# Patient Record
Sex: Female | Born: 1960 | Hispanic: No | Marital: Single | Smoking: Current every day smoker
Health system: Southern US, Community
[De-identification: ages and names within clinical notes are randomized; demographics above are authoritative.]

## PROBLEM LIST (undated history)

## (undated) DIAGNOSIS — K219 Gastro-esophageal reflux disease without esophagitis: Secondary | ICD-10-CM

## (undated) DIAGNOSIS — F988 Other specified behavioral and emotional disorders with onset usually occurring in childhood and adolescence: Secondary | ICD-10-CM

## (undated) DIAGNOSIS — F329 Major depressive disorder, single episode, unspecified: Secondary | ICD-10-CM

## (undated) DIAGNOSIS — F32A Depression, unspecified: Secondary | ICD-10-CM

## (undated) DIAGNOSIS — J45909 Unspecified asthma, uncomplicated: Secondary | ICD-10-CM

## (undated) DIAGNOSIS — F411 Generalized anxiety disorder: Secondary | ICD-10-CM

## (undated) DIAGNOSIS — F419 Anxiety disorder, unspecified: Secondary | ICD-10-CM

## (undated) HISTORY — DX: Gastro-esophageal reflux disease without esophagitis: K21.9

## (undated) HISTORY — DX: Unspecified asthma, uncomplicated: J45.909

---

## 1998-05-01 ENCOUNTER — Inpatient Hospital Stay (HOSPITAL_COMMUNITY): Admission: AD | Admit: 1998-05-01 | Discharge: 1998-05-01 | Payer: Self-pay | Admitting: Obstetrics & Gynecology

## 1998-08-22 ENCOUNTER — Inpatient Hospital Stay (HOSPITAL_COMMUNITY): Admission: AD | Admit: 1998-08-22 | Discharge: 1998-08-22 | Payer: Self-pay | Admitting: Obstetrics and Gynecology

## 1998-09-06 ENCOUNTER — Inpatient Hospital Stay (HOSPITAL_COMMUNITY): Admission: AD | Admit: 1998-09-06 | Discharge: 1998-09-08 | Payer: Self-pay

## 1998-10-23 ENCOUNTER — Other Ambulatory Visit: Admission: RE | Admit: 1998-10-23 | Discharge: 1998-10-23 | Payer: Self-pay | Admitting: Obstetrics and Gynecology

## 1999-06-04 ENCOUNTER — Other Ambulatory Visit: Admission: RE | Admit: 1999-06-04 | Discharge: 1999-06-04 | Payer: Self-pay | Admitting: Obstetrics and Gynecology

## 1999-10-31 ENCOUNTER — Other Ambulatory Visit: Admission: RE | Admit: 1999-10-31 | Discharge: 1999-10-31 | Payer: Self-pay | Admitting: Obstetrics and Gynecology

## 2001-07-24 ENCOUNTER — Encounter: Payer: Self-pay | Admitting: Internal Medicine

## 2001-07-24 ENCOUNTER — Ambulatory Visit (HOSPITAL_COMMUNITY): Admission: RE | Admit: 2001-07-24 | Discharge: 2001-07-24 | Payer: Self-pay | Admitting: Internal Medicine

## 2001-08-13 ENCOUNTER — Ambulatory Visit (HOSPITAL_COMMUNITY): Admission: RE | Admit: 2001-08-13 | Discharge: 2001-08-13 | Payer: Self-pay | Admitting: Internal Medicine

## 2001-11-09 ENCOUNTER — Encounter: Payer: Self-pay | Admitting: Internal Medicine

## 2001-11-09 ENCOUNTER — Encounter: Admission: RE | Admit: 2001-11-09 | Discharge: 2001-11-09 | Payer: Self-pay | Admitting: Internal Medicine

## 2002-01-15 ENCOUNTER — Other Ambulatory Visit: Admission: RE | Admit: 2002-01-15 | Discharge: 2002-01-15 | Payer: Self-pay | Admitting: Obstetrics & Gynecology

## 2003-02-05 HISTORY — PX: BUNIONECTOMY: SHX129

## 2003-03-25 ENCOUNTER — Other Ambulatory Visit: Admission: RE | Admit: 2003-03-25 | Discharge: 2003-03-25 | Payer: Self-pay | Admitting: Obstetrics & Gynecology

## 2004-04-04 ENCOUNTER — Encounter: Admission: RE | Admit: 2004-04-04 | Discharge: 2004-04-04 | Payer: Self-pay | Admitting: Internal Medicine

## 2008-04-21 ENCOUNTER — Emergency Department (HOSPITAL_COMMUNITY): Admission: EM | Admit: 2008-04-21 | Discharge: 2008-04-21 | Payer: Self-pay | Admitting: Emergency Medicine

## 2009-06-29 ENCOUNTER — Encounter: Admission: RE | Admit: 2009-06-29 | Discharge: 2009-06-29 | Payer: Self-pay | Admitting: Internal Medicine

## 2010-02-25 ENCOUNTER — Encounter: Payer: Self-pay | Admitting: Obstetrics & Gynecology

## 2010-02-25 ENCOUNTER — Encounter: Payer: Self-pay | Admitting: *Deleted

## 2010-05-17 LAB — URINE MICROSCOPIC-ADD ON

## 2010-05-17 LAB — URINALYSIS, ROUTINE W REFLEX MICROSCOPIC
Glucose, UA: NEGATIVE mg/dL
Protein, ur: NEGATIVE mg/dL
Specific Gravity, Urine: 1.01 (ref 1.005–1.030)

## 2012-11-06 ENCOUNTER — Other Ambulatory Visit: Payer: Self-pay | Admitting: Internal Medicine

## 2012-11-06 ENCOUNTER — Encounter (HOSPITAL_COMMUNITY): Payer: Self-pay

## 2012-11-06 ENCOUNTER — Emergency Department (HOSPITAL_COMMUNITY)
Admission: EM | Admit: 2012-11-06 | Discharge: 2012-11-06 | Disposition: A | Discharge status: Home / Self Care | Payer: BC Managed Care – PPO | Attending: Emergency Medicine | Admitting: Emergency Medicine

## 2012-11-06 DIAGNOSIS — F988 Other specified behavioral and emotional disorders with onset usually occurring in childhood and adolescence: Secondary | ICD-10-CM | POA: Insufficient documentation

## 2012-11-06 DIAGNOSIS — R1310 Dysphagia, unspecified: Secondary | ICD-10-CM

## 2012-11-06 DIAGNOSIS — T43205A Adverse effect of unspecified antidepressants, initial encounter: Secondary | ICD-10-CM | POA: Insufficient documentation

## 2012-11-06 DIAGNOSIS — F3289 Other specified depressive episodes: Secondary | ICD-10-CM | POA: Insufficient documentation

## 2012-11-06 DIAGNOSIS — T887XXA Unspecified adverse effect of drug or medicament, initial encounter: Secondary | ICD-10-CM

## 2012-11-06 DIAGNOSIS — F329 Major depressive disorder, single episode, unspecified: Secondary | ICD-10-CM | POA: Insufficient documentation

## 2012-11-06 DIAGNOSIS — K219 Gastro-esophageal reflux disease without esophagitis: Secondary | ICD-10-CM

## 2012-11-06 DIAGNOSIS — G2581 Restless legs syndrome: Secondary | ICD-10-CM | POA: Diagnosis present

## 2012-11-06 DIAGNOSIS — T50905A Adverse effect of unspecified drugs, medicaments and biological substances, initial encounter: Secondary | ICD-10-CM

## 2012-11-06 DIAGNOSIS — F411 Generalized anxiety disorder: Secondary | ICD-10-CM | POA: Insufficient documentation

## 2012-11-06 DIAGNOSIS — Z79899 Other long term (current) drug therapy: Secondary | ICD-10-CM | POA: Insufficient documentation

## 2012-11-06 HISTORY — DX: Anxiety disorder, unspecified: F41.9

## 2012-11-06 HISTORY — DX: Other specified behavioral and emotional disorders with onset usually occurring in childhood and adolescence: F98.8

## 2012-11-06 HISTORY — DX: Depression, unspecified: F32.A

## 2012-11-06 HISTORY — DX: Major depressive disorder, single episode, unspecified: F32.9

## 2012-11-06 NOTE — ED Provider Notes (Signed)
CSN: [id number]     Arrival date & time 11/06/12  7829 History   First MD Initiated Contact with Patient 11/06/12 0913     Chief Complaint  Patient presents with  . Back Pain   (Consider location/radiation/quality/duration/timing/severity/associated sxs/prior Treatment) HPI Comments: The patient is a 52 year old female who presents with restless lower extremities for the past 2 days. The patient notes that her symptoms are waxing and waning but are inhibiting her sleep. She denies as being painful but states it is uncomfortable. The severity is noted to be mild. She denies any nausea, vomiting, diarrhea, or fever. She denies any other associated symptoms. She has not had similar symptoms in the past. She states that she has been taking Celexa for the last 2 weeks which is a new medication for her. I called and spoke with her psychiatrist, Dr. Evelene Croon, he states that this is not a new medication for her and she has been on this medicine for one to 2 years.  Patient is a 52 y.o. female presenting with back pain. The history is provided by the patient.  Back Pain Associated symptoms: no abdominal pain, no chest pain, no dysuria, no fever and no headaches     Past Medical History  Diagnosis Date  . Depression   . Anxiety   . ADD (attention deficit disorder)    Past Surgical History  Procedure Laterality Date  . Bunionectomy     No family history on file. History  Substance Use Topics  . Smoking status: Not on file  . Smokeless tobacco: Not on file  . Alcohol Use: Not on file   OB History   Grav Para Term Preterm Abortions TAB SAB Ect Mult Living                 Review of Systems  Constitutional: Negative for fever and fatigue.  HENT: Negative for congestion, drooling and neck pain.   Eyes: Negative for pain.  Respiratory: Negative for cough and shortness of breath.   Cardiovascular: Negative for chest pain.  Gastrointestinal: Negative for nausea, vomiting, abdominal pain and  diarrhea.  Genitourinary: Negative for dysuria and hematuria.  Musculoskeletal: Negative for back pain and gait problem.  Skin: Negative for color change.  Neurological: Negative for dizziness and headaches.  Hematological: Negative for adenopathy.  Psychiatric/Behavioral: Negative for behavioral problems.  All other systems reviewed and are negative.    Allergies  Review of patient's allergies indicates no known allergies.  Home Medications   Current Outpatient Rx  Name  Route  Sig  Dispense  Refill  . albuterol (PROVENTIL HFA;VENTOLIN HFA) 108 (90 BASE) MCG/ACT inhaler   Inhalation   Inhale 2 puffs into the lungs every 6 (six) hours as needed for wheezing.         . citalopram (CELEXA) 40 MG tablet   Oral   Take 40 mg by mouth daily.         Marland Kitchen dextroamphetamine (DEXEDRINE SPANSULE) 15 MG 24 hr capsule   Oral   Take 15 mg by mouth daily.         . diazepam (VALIUM) 10 MG tablet   Oral   Take 10 mg by mouth every 8 (eight) hours as needed for anxiety.         Marland Kitchen ibuprofen (ADVIL,MOTRIN) 200 MG tablet   Oral   Take 800 mg by mouth every 6 (six) hours as needed for pain.          BP 112/68  Pulse 63  Temp(Src) 98.6 F (37 C) (Oral)  Resp 18  SpO2 96% Physical Exam  Nursing note and vitals reviewed. Constitutional: She is oriented to person, place, and time. She appears well-developed and well-nourished.  HENT:  Head: Normocephalic.  Mouth/Throat: Oropharynx is clear and moist. No oropharyngeal exudate.  Eyes: Conjunctivae and EOM are normal. Pupils are equal, round, and reactive to light.  Neck: Normal range of motion. Neck supple.  Cardiovascular: Normal rate, regular rhythm, normal heart sounds and intact distal pulses.  Exam reveals no gallop and no friction rub.   No murmur heard. Pulmonary/Chest: Effort normal and breath sounds normal. No respiratory distress. She has no wheezes.  Abdominal: Soft. Bowel sounds are normal. There is no tenderness.  There is no rebound and no guarding.  Musculoskeletal: Normal range of motion. She exhibits no edema and no tenderness.  Neurological: She is alert and oriented to person, place, and time. She has normal strength. No sensory deficit.  Reflex Scores:      Patellar reflexes are 2+ on the right side and 2+ on the left side.      Achilles reflexes are 2+ on the right side and 2+ on the left side. Mild restlessness in LE's.   2+ DP, PT, popliteal, and femoral pulses bilaterally. LE's are warm and well perfused, cap refill <2sec.   No clonus noted in LE's.   Skin: Skin is warm and dry.  Psychiatric: She has a normal mood and affect. Her behavior is normal.    ED Course  Procedures (including critical care time) Labs Review Labs Reviewed - No data to display Imaging Review No results found.  MDM   1. Medication side effect, initial encounter   2. Restless legs    9:57 AM 52 y.o. female here w/ restless lower extremities for 2 days. No hyperreflexia, fever, clonus. Patient is well-appearing on exam with only mild restlessness in her lower extremities. I called and spoke with her psychiatrist who notes that Celexa is not a new medication for her. She recommends halfing the dose to 20 mg per day and following up with her. Will provide these recommendations to the patient. I do not think this is serotonin syndrome based on mild sx, exam, and hx.    10:15 AM:  I have discussed the diagnosis/risks/treatment options with the patient and believe the pt to be eligible for discharge home to follow-up with Dr. Evelene Croon in the next 3-4 days. We also discussed returning to the ED immediately if new or worsening sx occur. We discussed the sx which are most concerning (e.g., worsening restlessness, fever, rigidity ) that necessitate immediate return. Any new prescriptions provided to the patient are listed below.  New Prescriptions   No medications on file     Junius Argyle, MD 11/06/12 1340

## 2012-11-06 NOTE — ED Notes (Addendum)
Patient less agitated. States she can now go to sleep. Patient to FU with her PCP

## 2012-11-06 NOTE — ED Notes (Addendum)
Patient complaining of low back pain/discomfort. Patient unable to sit still, discomfort radiates down to both legs. Started celexa about 2 weeks ago. Restlessness is worse at night.

## 2012-11-12 ENCOUNTER — Other Ambulatory Visit (HOSPITAL_COMMUNITY): Payer: BC Managed Care – PPO | Admitting: Psychiatry

## 2012-11-12 ENCOUNTER — Encounter (HOSPITAL_COMMUNITY): Payer: Self-pay

## 2012-11-12 DIAGNOSIS — F901 Attention-deficit hyperactivity disorder, predominantly hyperactive type: Secondary | ICD-10-CM

## 2012-11-12 DIAGNOSIS — F411 Generalized anxiety disorder: Secondary | ICD-10-CM | POA: Insufficient documentation
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
## 2012-11-13 ENCOUNTER — Other Ambulatory Visit (HOSPITAL_COMMUNITY): Payer: BC Managed Care – PPO | Admitting: Psychiatry

## 2012-11-13 DIAGNOSIS — F339 Major depressive disorder, recurrent, unspecified: Secondary | ICD-10-CM
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
## 2012-11-14 ENCOUNTER — Encounter (HOSPITAL_COMMUNITY): Payer: Self-pay | Admitting: Psychiatry

## 2012-11-14 NOTE — Progress Notes (Signed)
    Daily Group Progress Note  Program: IOP  Group Time: 9-10:30 am  Participation Level: None  Behavioral Response: Withdrawn  Type of Therapy:  Process Group  Summary of Progress: The patient was brought in after the check-in and discussion. She sat quielty and was introduced by the Child psychotherapist. The patient was teary. When asked to share a little bit of herself with the group, the patient reported she wasn't ready to do that. She listened, but did not engage in the session.  Group Time: 10:30-12 am  Participation Level:  None  Behavioral Response: patient met with psychiatrist and did not return to group  Type of Therapy: Psycho-education Group  Summary of Progress:Patient went with social worker to meet with psychiatrist and was not present for the remainder of the session.  Carman Ching, LCSW

## 2012-11-16 ENCOUNTER — Telehealth (HOSPITAL_COMMUNITY): Payer: Self-pay | Admitting: Psychiatry

## 2012-11-16 ENCOUNTER — Other Ambulatory Visit (HOSPITAL_COMMUNITY): Payer: BC Managed Care – PPO

## 2012-11-17 ENCOUNTER — Other Ambulatory Visit: Payer: BC Managed Care – PPO

## 2012-11-17 ENCOUNTER — Encounter (HOSPITAL_COMMUNITY): Payer: Self-pay

## 2012-11-17 MED ORDER — RISPERIDONE 0.5 MG PO TABS: 0.5000 mg | ORAL_TABLET | Freq: Every day | ORAL | Status: DC
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
## 2012-11-18 ENCOUNTER — Telehealth (HOSPITAL_COMMUNITY): Payer: Self-pay

## 2012-11-18 ENCOUNTER — Telehealth (HOSPITAL_COMMUNITY): Payer: Self-pay | Admitting: Psychiatry

## 2012-11-18 ENCOUNTER — Ambulatory Visit
Admission: RE | Admit: 2012-11-18 | Discharge: 2012-11-18 | Disposition: A | Discharge status: Home / Self Care | Payer: No Typology Code available for payment source | Source: Ambulatory Visit | Attending: Internal Medicine | Admitting: Internal Medicine

## 2012-11-18 ENCOUNTER — Other Ambulatory Visit (HOSPITAL_COMMUNITY): Payer: BC Managed Care – PPO | Attending: Psychiatry

## 2012-11-18 DIAGNOSIS — R1310 Dysphagia, unspecified: Secondary | ICD-10-CM

## 2012-11-18 DIAGNOSIS — IMO0001 Reserved for inherently not codable concepts without codable children: Secondary | ICD-10-CM

## 2012-11-18 DIAGNOSIS — K219 Gastro-esophageal reflux disease without esophagitis: Secondary | ICD-10-CM

## 2012-11-19 ENCOUNTER — Telehealth (HOSPITAL_COMMUNITY): Payer: Self-pay | Admitting: Psychiatry

## 2012-11-19 ENCOUNTER — Other Ambulatory Visit (HOSPITAL_COMMUNITY): Payer: BC Managed Care – PPO
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
## 2012-11-20 ENCOUNTER — Other Ambulatory Visit (HOSPITAL_COMMUNITY): Payer: BC Managed Care – PPO | Admitting: Psychiatry

## 2012-11-20 DIAGNOSIS — F339 Major depressive disorder, recurrent, unspecified: Secondary | ICD-10-CM

## 2012-11-20 NOTE — Progress Notes (Signed)
    Daily Group Progress Note  Program: IOP  Group Time: 9:00-10:30 am   Participation Level: Active  Behavioral Response: Appropriate  Type of Therapy:  Process Group  Summary of Progress: Pt showed up to group after missing two days in a row. Pt was scheduled to be discharged due to the absences policy, but was allowed to remain in the program if she arrives on time and does not miss any more days. Pt was educated on the importance of making her recovery a priority and being on time reinforces the commitment she has towards her wellness. Pt still struggles with high depression symptoms and low self esteem.      Group Time: 10:30 am - 12:00 pm   Participation Level:  Active  Behavioral Response: Appropriate  Type of Therapy: Psycho-education Group  Summary of Progress: Pt learned about the symptoms of depression and how to recognize to intervene with reducing depression symptoms.   Carman Ching, LCSW

## 2012-11-20 NOTE — Progress Notes (Signed)
Patient ID: Nancy Mcintyre, female   DOB: 1960-07-23, 52 y.o.   MRN: [id number] Patient and interviewed today, and patient has been leg group for 3 days but states that the groups are helping her and does not want to quit. Patient is extremely restless fidgety disorganized intrusive disruptive during our interview. Patient carries a previous diagnosis of ADHD and had been taking Dexedrine spansules, and,  but these were discontinued for by Dr.Kaur, who saw her after patient had been to the ED for a panic attack. Patient was seen by our PA Hessie Diener watt will put her on Risperdal. In taking a detailed history patient does have signs and symptoms of ADHD and is extremely disorganized. Discussed restarting the Dexedrine and discontinuing the Risperdal and patient stated understanding. Patient is alert oriented x3, affect is appropriate mood is anxious with no suicidal or homicidal ideation and no hallucinations or delusions.

## 2012-11-23 ENCOUNTER — Other Ambulatory Visit (HOSPITAL_COMMUNITY): Payer: BC Managed Care – PPO | Admitting: Psychiatry

## 2012-11-23 DIAGNOSIS — F339 Major depressive disorder, recurrent, unspecified: Secondary | ICD-10-CM
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
## 2012-11-24 ENCOUNTER — Other Ambulatory Visit (HOSPITAL_COMMUNITY): Payer: BC Managed Care – PPO | Admitting: Psychiatry

## 2012-11-24 DIAGNOSIS — F339 Major depressive disorder, recurrent, unspecified: Secondary | ICD-10-CM

## 2012-11-24 NOTE — Progress Notes (Signed)
    Daily Group Progress Note  Program: IOP  Group Time: 9:00-10:30 am   Participation Level: Active  Behavioral Response: Appropriate  Type of Therapy:  Process Group  Summary of Progress: Pt did not talk and left the room when her emotions appeared to bother her. Pt was encouraged to come tomorrow and participate.      Group Time: 10:30 am - 12:00 pm   Participation Level:  Active  Behavioral Response: Appropriate  Type of Therapy: Psycho-education Group  Summary of Progress: Pt participated in a group on grief and loss and identified current losses impacting wellness and appropriate grieving strategies.   Carman Ching, LCSW

## 2012-11-25 ENCOUNTER — Other Ambulatory Visit (HOSPITAL_COMMUNITY): Payer: BC Managed Care – PPO | Admitting: Psychiatry

## 2012-11-25 DIAGNOSIS — F339 Major depressive disorder, recurrent, unspecified: Secondary | ICD-10-CM

## 2012-11-26 ENCOUNTER — Other Ambulatory Visit (HOSPITAL_COMMUNITY): Payer: BC Managed Care – PPO | Admitting: Psychiatry

## 2012-11-26 DIAGNOSIS — F339 Major depressive disorder, recurrent, unspecified: Secondary | ICD-10-CM

## 2012-11-26 NOTE — Progress Notes (Signed)
    Daily Group Progress Note  Program: IOP  Group Time: 9:00-10:30 am   Participation Level: Active  Behavioral Response: Appropriate  Type of Therapy:  Process Group  Summary of Progress: Pt learned about the symptoms of depression and how the symptoms are present in their daily living and spoke about each symptom they are experiencing.        Carman Ching, LCSW

## 2012-11-26 NOTE — Progress Notes (Signed)
    Daily Group Progress Note  Program: IOP  Group Time: 9:00-10:30 am   Participation Level: Active  Behavioral Response: Appropriate  Type of Therapy:  Process Group  Summary of Progress: Pt is making good progress trusting the group coming on time and talking more. Pt is identifying how past traumas impact her current behaviors and tenuous relationships with others.      Group Time: 10:30 am - 12:00 pm   Participation Level:  Active  Behavioral Response: Appropriate  Type of Therapy: Psycho-education Group  Summary of Progress: Pt learned about low self-esteem and how it begins and effects mood and behaviors today.   Carman Ching, LCSW

## 2012-11-26 NOTE — Progress Notes (Signed)
    Daily Group Progress Note  Program: IOP  Group Time: 9:00 am -12:00 pm  Participation Level: Active  Behavioral Response: Appropriate  Type of Therapy:  Process Group  Summary of Progress: member participated in two goodbye ceremonies for members ending the group and had the opportunity for healthy closure. Pt identified healthy forms of grieving losses.      Amitai Delaughter E, LCSW 

## 2012-11-27 ENCOUNTER — Other Ambulatory Visit (HOSPITAL_COMMUNITY): Payer: BC Managed Care – PPO | Admitting: Psychiatry

## 2012-11-27 DIAGNOSIS — F339 Major depressive disorder, recurrent, unspecified: Secondary | ICD-10-CM

## 2012-11-30 ENCOUNTER — Other Ambulatory Visit (HOSPITAL_COMMUNITY): Payer: BC Managed Care – PPO | Admitting: Psychiatry

## 2012-11-30 DIAGNOSIS — F339 Major depressive disorder, recurrent, unspecified: Secondary | ICD-10-CM

## 2012-11-30 NOTE — Progress Notes (Signed)
Patient ID: Nancy Mcintyre, female   DOB: 1960-04-30, 52 y.o.   MRN: [id number] D: This is a 52 yr old, single, Hispanic, female, who was referred per Dr. Evelene Croon, treatment for increased depressive and anxiety symptoms, passive SI, ADD, OCD, and paranoia (pt states she feels people are talking about her). Denies any A/V hallucinations. Discussed safety options with pt.   Admits to a suicidal gesture whenever she cut her wrist ~ three months ago. Denies any previous psychiatric hospitalizations. Family psychiatric illnesses: Sister struggles with depression and deceased mother was depressed. Has seen Dr. Milagros Evener and Hurley Cisco, LCSW for five years. According to pt, her symptoms worsened a couple of weeks ago. Stressors/Triggers: 1) Unresolved grief/loss issues: Female friend died of cancer on November 09, 2012 at [facility], which is the same hospital where pt's mother died in 79 and fiance' died in July 05, 2004. "I have been avoiding [employer] since they died." 2) Job Pharmacologist) of ~ seventeen years. C/O difficulty functioning at work. Has been written up due to multiple tardiness. 3) Two or Three months ago pt's oldest son moved out of the home. 4) No support system. Pt completed MH-IOP today.  Denies any SI/HI or A/V hallucinations.  Continues to struggle with depressive symptoms.  "I have no purpose.  I stayed in the bed all weekend."  Reports that her sister returned to [place] today.  A:  D/C today.  Will follow up with Boneta Lucks, [facility] on 12-07-12 @ 11 a.m and Dr. Evelene Croon on 12-10-12 @ 6:15 pm.  Referred pt to [facility].  Also, assisted pt with structuring her mornings, afternoons, and evenings.  RTW on 12-23-12.  R:  Pt receptive.

## 2012-11-30 NOTE — Progress Notes (Signed)
    Daily Group Progress Note  Program: IOP  Group Time: 9:00-10:30 am   Participation Level: Active  Behavioral Response: Appropriate  Type of Therapy:  Process Group  Summary of Progress: Pt is more talkative and engaged in the group and smiling more. Pt received feedback on her progress. Pt said she feels better about herself and less depressed.      Group Time: 10:30 am - 12:00 pm   Participation Level:  None  Behavioral Response: noen  Type of Therapy: Psycho-education Group  Summary of Progress: Group ended an hour early due to Clinical research associate having a family emergency.   Carman Ching, LCSW

## 2012-11-30 NOTE — Progress Notes (Signed)
    Daily Group Progress Note  Program: IOP  Group Time: 9:00-10:30 am   Participation Level: Active  Behavioral Response: Appropriate  Type of Therapy:  Process Group  Summary of Progress: Today was Pts last day in the group. Pt was sad to leave and expressed worry about how she will get ongoing support after she leaves. Pt agreed to contact the [facility] to sign up for their support groups for continued support. Pt said she feels less depressed and more optimistic than when she started in the group and was proud of herself for opening up some and talking. Pt appeared more confident.      Group Time: 10:30 am - 12:00 pm   Participation Level:  Active  Behavioral Response: Appropriate  Type of Therapy: Psycho-education Group  Summary of Progress: Pt participated in a group with a focus on grief and loss and identified losses impacting overall wellness and effective grieving strategies.   Carman Ching, LCSW

## 2012-11-30 NOTE — Patient Instructions (Addendum)
Patient completed MH-IOP today.  Will follow up with Boneta Lucks, [facility] on 12-07-12 @ 11a.m and Dr. Evelene Croon on 12-10-12 @ 6:15 pm.  Encouraged support groups.  Return to work on 12-23-12, without any restrictions.

## 2012-11-30 NOTE — Progress Notes (Signed)
Discharge Note  Patient:  Nancy Mcintyre is an 52 y.o., female DOB:  November 27, 1960  Date of Admission:  11/12/12  Date of Discharge: 11/30/12.  Reason for Admission: Depression and anxiety  Hospital Course: Patient was admitted to IOP for her depression and anxiety. She was continued on her medications initially and was started on Risperdal   0.5 mg at at bedtime because of her anxiety , patient did not like how this medication made her feel and discontinued it. Patient had difficulty getting here on time and was found to have a diagnosis of ADHD and had discontinued her Dexedrine. It was recommended that she did to restart her Dexedrine span Scholes 10 mg a.m. and no in which she did and began arriving on time and was able to concentrate well and was able to give and receive feedback very well. She visited Dr. Evelene Croon and complained of insomnia and so was started on Elavil 10 mg at bedtime. This helped her insomnia. She was continued on all of her other medications and did well sleep and appetite were good mood was stable with no suicidal or homicidal ideation and no hallucinations or delusions.  Mental Status at Discharge: Alert, oriented x3, mildly anxious at leaving the program but doing well mood was stable with no suicidal or homicidal ideation, no hallucinations or delusions were noted. Recent and remote memory was good, judgment and insight was good, concentration and recall are good.  Lab Results: No results found for this or any previous visit (from the past 48 hour(s)).  Current outpatient prescriptions:albuterol (PROVENTIL HFA;VENTOLIN HFA) 108 (90 BASE) MCG/ACT inhaler, Inhale 2 puffs into the lungs every 6 (six) hours as needed for wheezing., Disp: , Rfl: ;  citalopram (CELEXA) 40 MG tablet, Take 20 mg by mouth daily. , Disp: , Rfl: ;  dextroamphetamine (DEXEDRINE SPANSULE) 15 MG 24 hr capsule, Take 15 mg by mouth daily., Disp: , Rfl:  diazepam (VALIUM) 10 MG tablet, Take 10 mg by  mouth every 8 (eight) hours as needed for anxiety., Disp: , Rfl: ;  esomeprazole (NEXIUM) 40 MG capsule, Take 40 mg by mouth 2 (two) times daily., Disp: , Rfl: ;  ibuprofen (ADVIL,MOTRIN) 200 MG tablet, Take 800 mg by mouth every 6 (six) hours as needed for pain., Disp: , Rfl:   Axis Diagnosis:   Axis I: Anxiety Disorder NOS, Major Depression, Recurrent severe and Post Traumatic Stress Disorder Axis II: Cluster B Traits Axis III:  Past Medical History  Diagnosis Date  . Depression   . Anxiety   . ADD (attention deficit disorder)   . GERD (gastroesophageal reflux disease)   . Asthma    Axis IV: other psychosocial or environmental problems, problems related to social environment and problems with primary support group Axis V: 61-70 mild symptoms   Level of Care:  OP  Discharge destination:  Home  Is patient on multiple antipsychotic therapies at discharge:  No    Has Patient had three or more failed trials of antipsychotic monotherapy by history:  No  Patient phone:  [phone] (home)  Patient address:   [address] [place] [address],   Follow-up recommendations:  Activity:  As tolerated Diet:  Regular Other:  Follow up with Dr. Evelene Croon for medications and Gennifer brown for therapy  Comments:  None  The patient received suicide prevention pamphlet:  No   Margit Banda 11/30/2012, 11:14 AM

## 2012-12-01 ENCOUNTER — Other Ambulatory Visit (HOSPITAL_COMMUNITY): Payer: BC Managed Care – PPO

## 2012-12-02 ENCOUNTER — Other Ambulatory Visit (HOSPITAL_COMMUNITY): Payer: BC Managed Care – PPO

## 2012-12-03 ENCOUNTER — Other Ambulatory Visit (HOSPITAL_COMMUNITY): Payer: BC Managed Care – PPO

## 2012-12-07 ENCOUNTER — Encounter (HOSPITAL_COMMUNITY): Payer: Self-pay | Admitting: Psychiatry

## 2012-12-07 ENCOUNTER — Encounter (INDEPENDENT_AMBULATORY_CARE_PROVIDER_SITE_OTHER): Payer: Self-pay

## 2012-12-07 ENCOUNTER — Ambulatory Visit (INDEPENDENT_AMBULATORY_CARE_PROVIDER_SITE_OTHER): Payer: BC Managed Care – PPO | Admitting: Psychiatry

## 2012-12-07 DIAGNOSIS — F329 Major depressive disorder, single episode, unspecified: Secondary | ICD-10-CM

## 2012-12-07 DIAGNOSIS — F339 Major depressive disorder, recurrent, unspecified: Secondary | ICD-10-CM
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
## 2012-12-14 ENCOUNTER — Encounter (HOSPITAL_COMMUNITY): Payer: Self-pay | Admitting: Emergency Medicine

## 2012-12-14 ENCOUNTER — Encounter (HOSPITAL_COMMUNITY): Payer: Self-pay | Admitting: General Practice

## 2012-12-14 ENCOUNTER — Inpatient Hospital Stay (HOSPITAL_COMMUNITY)
Admission: AD | Admit: 2012-12-14 | Discharge: 2012-12-19 | DRG: 885 | Disposition: A | Discharge status: Home / Self Care | Payer: BC Managed Care – PPO | Source: Intra-hospital | Attending: Psychiatry | Admitting: Psychiatry

## 2012-12-14 ENCOUNTER — Emergency Department (HOSPITAL_COMMUNITY)
Admission: EM | Admit: 2012-12-14 | Discharge: 2012-12-14 | Disposition: A | Discharge status: Other Acute Inpatient Hospital | Payer: BC Managed Care – PPO | Attending: Emergency Medicine | Admitting: Emergency Medicine

## 2012-12-14 DIAGNOSIS — F17200 Nicotine dependence, unspecified, uncomplicated: Secondary | ICD-10-CM | POA: Insufficient documentation

## 2012-12-14 DIAGNOSIS — F411 Generalized anxiety disorder: Secondary | ICD-10-CM | POA: Insufficient documentation

## 2012-12-14 DIAGNOSIS — F332 Major depressive disorder, recurrent severe without psychotic features: Principal | ICD-10-CM | POA: Diagnosis present

## 2012-12-14 DIAGNOSIS — F988 Other specified behavioral and emotional disorders with onset usually occurring in childhood and adolescence: Secondary | ICD-10-CM | POA: Insufficient documentation

## 2012-12-14 DIAGNOSIS — F3289 Other specified depressive episodes: Secondary | ICD-10-CM | POA: Insufficient documentation

## 2012-12-14 DIAGNOSIS — Z79899 Other long term (current) drug therapy: Secondary | ICD-10-CM | POA: Insufficient documentation

## 2012-12-14 DIAGNOSIS — J45909 Unspecified asthma, uncomplicated: Secondary | ICD-10-CM | POA: Insufficient documentation

## 2012-12-14 DIAGNOSIS — K219 Gastro-esophageal reflux disease without esophagitis: Secondary | ICD-10-CM | POA: Diagnosis present

## 2012-12-14 DIAGNOSIS — F339 Major depressive disorder, recurrent, unspecified: Secondary | ICD-10-CM | POA: Diagnosis present

## 2012-12-14 DIAGNOSIS — X789XXA Intentional self-harm by unspecified sharp object, initial encounter: Secondary | ICD-10-CM | POA: Insufficient documentation

## 2012-12-14 DIAGNOSIS — R45851 Suicidal ideations: Secondary | ICD-10-CM

## 2012-12-14 DIAGNOSIS — F329 Major depressive disorder, single episode, unspecified: Secondary | ICD-10-CM | POA: Insufficient documentation

## 2012-12-14 DIAGNOSIS — S61509A Unspecified open wound of unspecified wrist, initial encounter: Secondary | ICD-10-CM | POA: Insufficient documentation

## 2012-12-14 DIAGNOSIS — IMO0002 Reserved for concepts with insufficient information to code with codable children: Secondary | ICD-10-CM | POA: Insufficient documentation

## 2012-12-14 DIAGNOSIS — F909 Attention-deficit hyperactivity disorder, unspecified type: Secondary | ICD-10-CM | POA: Diagnosis present

## 2012-12-14 LAB — RAPID URINE DRUG SCREEN, HOSP PERFORMED
Barbiturates: NOT DETECTED
Benzodiazepines: POSITIVE — AB
Cocaine: NOT DETECTED
Tetrahydrocannabinol: NOT DETECTED

## 2012-12-14 LAB — COMPREHENSIVE METABOLIC PANEL
ALT: 18 U/L (ref 0–35)
AST: 22 U/L (ref 0–37)
Calcium: 9.2 mg/dL (ref 8.4–10.5)
GFR calc Af Amer: 86 mL/min — ABNORMAL LOW (ref >90)
Sodium: 139 mEq/L (ref 135–145)
Total Protein: 7.5 g/dL (ref 6.0–8.3)

## 2012-12-14 LAB — CBC WITH DIFFERENTIAL/PLATELET
Basophils Absolute: 0 10*3/uL (ref 0.0–0.1)
Eosinophils Absolute: 0.6 10*3/uL (ref 0.0–0.7)
Hemoglobin: 13.1 g/dL (ref 12.0–15.0)
Lymphocytes Relative: 36 % (ref 12–46)
MCHC: 33.9 g/dL (ref 30.0–36.0)
Neutrophils Relative %: 51 % (ref 43–77)
RDW: 14.9 % (ref 11.5–15.5)

## 2012-12-14 MED ORDER — PANTOPRAZOLE SODIUM 40 MG PO TBEC
40.0000 mg | DELAYED_RELEASE_TABLET | Freq: Every day | ORAL | Status: DC
  Administered 2012-12-14: 40 mg via ORAL
  Filled 2012-12-14: qty 1

## 2012-12-14 MED ORDER — CLONAZEPAM 1 MG PO TABS
1.0000 mg | ORAL_TABLET | Freq: Three times a day (TID) | ORAL | Status: DC
  Administered 2012-12-14 – 2012-12-16 (×5): 1 mg via ORAL
  Filled 2012-12-14 (×6): qty 1

## 2012-12-14 MED ORDER — ACETAMINOPHEN 325 MG PO TABS: 650.0000 mg | ORAL_TABLET | Freq: Four times a day (QID) | ORAL | Status: DC | PRN

## 2012-12-14 MED ORDER — PANTOPRAZOLE SODIUM 40 MG PO TBEC
80.0000 mg | DELAYED_RELEASE_TABLET | Freq: Every day | ORAL | Status: DC
  Administered 2012-12-15 – 2012-12-19 (×5): 80 mg via ORAL
  Filled 2012-12-14 (×6): qty 2

## 2012-12-14 MED ORDER — IBUPROFEN 200 MG PO TABS: 400.0000 mg | ORAL_TABLET | Freq: Four times a day (QID) | ORAL | Status: DC | PRN

## 2012-12-14 MED ORDER — CITALOPRAM HYDROBROMIDE 10 MG PO TABS
20.0000 mg | ORAL_TABLET | Freq: Every day | ORAL | Status: DC
  Filled 2012-12-14: qty 2

## 2012-12-14 MED ORDER — POTASSIUM CHLORIDE CRYS ER 20 MEQ PO TBCR: 20.0000 meq | EXTENDED_RELEASE_TABLET | Freq: Two times a day (BID) | ORAL | Status: DC

## 2012-12-14 MED ORDER — MAGNESIUM HYDROXIDE 400 MG/5ML PO SUSP: 30.0000 mL | Freq: Every day | ORAL | Status: DC | PRN

## 2012-12-14 MED ORDER — IBUPROFEN 400 MG PO TABS: 600.0000 mg | ORAL_TABLET | Freq: Three times a day (TID) | ORAL | Status: DC | PRN

## 2012-12-14 MED ORDER — CLONAZEPAM 1 MG PO TBDP: 1.0000 mg | ORAL_TABLET | Freq: Three times a day (TID) | ORAL | Status: DC

## 2012-12-14 MED ORDER — ALBUTEROL SULFATE HFA 108 (90 BASE) MCG/ACT IN AERS: 2.0000 | INHALATION_SPRAY | Freq: Four times a day (QID) | RESPIRATORY_TRACT | Status: DC | PRN

## 2012-12-14 MED ORDER — ONDANSETRON HCL 4 MG PO TABS: 4.0000 mg | ORAL_TABLET | Freq: Three times a day (TID) | ORAL | Status: DC | PRN

## 2012-12-14 MED ORDER — ALUM & MAG HYDROXIDE-SIMETH 200-200-20 MG/5ML PO SUSP: 30.0000 mL | ORAL | Status: DC | PRN

## 2012-12-14 MED ORDER — NICOTINE 21 MG/24HR TD PT24: 21.0000 mg | MEDICATED_PATCH | Freq: Every day | TRANSDERMAL | Status: DC

## 2012-12-14 MED ORDER — CITALOPRAM HYDROBROMIDE 20 MG PO TABS
20.0000 mg | ORAL_TABLET | Freq: Every day | ORAL | Status: DC
  Administered 2012-12-14 – 2012-12-16 (×3): 20 mg via ORAL
  Filled 2012-12-14 (×3): qty 1

## 2012-12-14 MED ORDER — ACETAMINOPHEN 325 MG PO TABS: 650.0000 mg | ORAL_TABLET | ORAL | Status: DC | PRN

## 2012-12-14 MED ORDER — DIAZEPAM 5 MG PO TABS
10.0000 mg | ORAL_TABLET | Freq: Three times a day (TID) | ORAL | Status: DC | PRN
Start: 2012-12-14 — End: 2012-12-14

## 2012-12-14 MED ORDER — HYDROXYZINE HCL 50 MG PO TABS
50.0000 mg | ORAL_TABLET | Freq: Three times a day (TID) | ORAL | Status: DC | PRN
  Administered 2012-12-17: 50 mg via ORAL
  Filled 2012-12-14: qty 1

## 2012-12-14 MED ORDER — AMITRIPTYLINE HCL 10 MG PO TABS
10.0000 mg | ORAL_TABLET | Freq: Every day | ORAL | Status: DC
  Filled 2012-12-14: qty 1

## 2012-12-14 MED ORDER — POTASSIUM CHLORIDE CRYS ER 20 MEQ PO TBCR
40.0000 meq | EXTENDED_RELEASE_TABLET | Freq: Once | ORAL | Status: AC
  Administered 2012-12-14: 40 meq via ORAL
  Filled 2012-12-14: qty 2

## 2012-12-14 NOTE — ED Notes (Signed)
sherriff has arrived for transport

## 2012-12-14 NOTE — ED Notes (Signed)
PA at bedside.

## 2012-12-14 NOTE — Progress Notes (Signed)
Adult Psychoeducational Group Note  Date:  12/14/2012 Time:  9:01 PM  Group Topic/Focus:  Wrap-Up Group:   The focus of this group is to help patients review their daily goal of treatment and discuss progress on daily workbooks.  Participation Level:  Active  Participation Quality:  Appropriate  Affect:  Appropriate  Cognitive:  Appropriate  Insight: Appropriate  Engagement in Group:  Engaged  Modes of Intervention:  Support  Additional Comments:  Pt stated that she is feeling 100% better and that she had a relapse which brought her here but now she is feeling so much better she stated that she "is back"   Lilli Dewald 12/14/2012, 9:01 PM
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
## 2012-12-14 NOTE — Progress Notes (Signed)
Writer consulted with the extender Nanine Means) regarding the patient meeting criteria for inpatient hospitalization.    Writer informed the ER MD (Dr. Effie Shy) and the nurse that the [facility] will be having discharges late today.

## 2012-12-14 NOTE — ED Notes (Signed)
PATIENT RECEIVED IN TO POD C 24. PT SEEMS ANXIOUS. DISCUSSED PLAN OF CARE WITH  PATIENT. SHE HAS BEEN MADE AWARE THAT SHE HAS BEEN ACCEPTED AT [facility]. PT STATES SHE DOESN'T WANT TO GO THERE. STATES SHE WANTS TO GO HOME WITH HER KIDS AND SHE WANTS TO GO SMOKE CIGARETTES. PT ASKING WHAT HAPPENS IF SHE DOESN'T WANT TO GO TO [facility] AND SHE GETS HER STUFF AND WALKS OUT. STATES HER CAR IS IN THE PARKING LOT. ADVISED PT THAT DUE TO HER CUTTING HER WRIST THERE IS CONCERN FOR HER SAFETY. PT NOT HAPPY ABOUT GOING TO [facility]. NOTIFIED FRANCES SANFORD PA OF PT FLIGHT RISK.
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
## 2012-12-14 NOTE — Progress Notes (Signed)
Writer informed the ER MD (Dr. Effie Shy) and nurse Lorin Picket) working with the patient that the Tele Assessment has been completed; however, an extender at [facility] must review the patients case for a final disposition on the patient.

## 2012-12-14 NOTE — ED Notes (Signed)
PATIENT HAS BEEN ACCEPTED AT [facility]. PER ERIC PT DOES NOT HAVE BED AT THIS TIME THEY DO ANTICIPATE DISCHARGES
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
## 2012-12-14 NOTE — Tx Team (Signed)
Initial Interdisciplinary Treatment Plan  PATIENT STRENGTHS: (choose at least two) Average or above average intelligence Communication skills General fund of knowledge  PATIENT STRESSORS: Health problems Loss of boy-friend   PROBLEM LIST: Problem List/Patient Goals Date to be addressed Date deferred Reason deferred Estimated date of resolution  Depression 12/14/2012           Anxiety 12/14/2012                                          DISCHARGE CRITERIA:  Ability to meet basic life and health needs Improved stabilization in mood, thinking, and/or behavior Motivation to continue treatment in a less acute level of care  PRELIMINARY DISCHARGE PLAN: Attend PHP/IOP Outpatient therapy Return to previous living arrangement  PATIENT/FAMIILY INVOLVEMENT: This treatment plan has been presented to and reviewed with the patient, COZETTE BRAGGS.  The patient and family have been given the opportunity to ask questions and make suggestions.  Marzetta Board E 12/14/2012, 6:44 PM

## 2012-12-14 NOTE — ED Notes (Signed)
Pt refusing patient gown; Pt sts "I'm not putting on a gown for this cut on my wrist. That's just ridiculous." RN informed

## 2012-12-14 NOTE — ED Notes (Signed)
[facility] called to transport patient

## 2012-12-15 ENCOUNTER — Encounter (HOSPITAL_COMMUNITY): Payer: Self-pay | Admitting: Psychiatry

## 2012-12-15 DIAGNOSIS — F3160 Bipolar disorder, current episode mixed, unspecified: Secondary | ICD-10-CM

## 2012-12-15 DIAGNOSIS — F22 Delusional disorders: Secondary | ICD-10-CM

## 2012-12-15 LAB — COMPREHENSIVE METABOLIC PANEL
AST: 20 U/L (ref 0–37)
Albumin: 3.5 g/dL (ref 3.5–5.2)
BUN: 15 mg/dL (ref 6–23)
Calcium: 9 mg/dL (ref 8.4–10.5)
Chloride: 102 mEq/L (ref 96–112)
Creatinine, Ser: 0.92 mg/dL (ref 0.50–1.10)
GFR calc Af Amer: 82 mL/min — ABNORMAL LOW (ref >90)
Glucose, Bld: 73 mg/dL (ref 70–99)
Total Bilirubin: 0.2 mg/dL — ABNORMAL LOW (ref 0.3–1.2)
Total Protein: 6.3 g/dL (ref 6.0–8.3)

## 2012-12-15 MED ORDER — ENSURE COMPLETE PO LIQD
237.0000 mL | Freq: Every day | ORAL | Status: DC
  Administered 2012-12-15 – 2012-12-18 (×4): 237 mL via ORAL

## 2012-12-15 MED ORDER — ADULT MULTIVITAMIN W/MINERALS CH
1.0000 | ORAL_TABLET | Freq: Every day | ORAL | Status: DC
  Administered 2012-12-15 – 2012-12-19 (×5): 1 via ORAL
  Filled 2012-12-15 (×9): qty 1

## 2012-12-15 MED ORDER — INFLUENZA VAC SPLIT QUAD 0.5 ML IM SUSP
0.5000 mL | INTRAMUSCULAR | Status: AC
  Administered 2012-12-16: 0.5 mL via INTRAMUSCULAR
  Filled 2012-12-15: qty 0.5

## 2012-12-15 MED ORDER — RISPERIDONE 0.5 MG PO TBDP
0.5000 mg | ORAL_TABLET | Freq: Every day | ORAL | Status: DC
  Filled 2012-12-15 (×3): qty 1

## 2012-12-15 NOTE — ED Provider Notes (Signed)
Medical screening examination/treatment/procedure(s) were conducted as a shared visit with non-physician practitioner(s) and myself.  I personally evaluated the patient during the encounter.  Pt with SI - self inflicted wound to the L volar forearm - repaired by PA.  No bleeding on my exam after pt repaired.  Flat affect.  Clinical Impression: SI, laceration of forearm      Vida Roller, MD 12/15/12 [phone]

## 2012-12-15 NOTE — Progress Notes (Signed)
D: Patient denies SI/HI and A/V hallucinations; patient reports that the klonopin makes her sleepy and that is why she refused 1200 dose of klonopin  A: Monitored q 15 minutes; patient encouraged to attend groups; patient educated about medications; patient given medications per physician orders; patient encouraged to express feelings and/or concerns  R: Patient is cooperative and appropriate; patient is superficial and forwards little information; patient's interaction with staff and peers is appropriate;  patient is taking medications as prescribed and tolerating medications; patient is attending all groups and engaging
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
## 2012-12-15 NOTE — [facility] Group Notes (Signed)
[facility] LCSW Group Therapy  12/15/2012 1:15 PM   Type of Therapy:  Group Therapy  Participation Level:  Did Not Attend  Chasin Findling Horton, LCSW 12/15/2012 2:12 PM   

## 2012-12-15 NOTE — Progress Notes (Addendum)
D: Pt is anxious in affect and mood. Pt is adjusting to the unit appropriately. Pt attended group this evening. Pt observed interacting appropriately within the milieu. She continues to deny cutting her wrist as a suicide attempt. She reports that if she was suicidal she would had cut deeper. She is currently denying any SI/HI. A: Writer administered scheduled medications to pt. Continued support and availability as needed was extended to this pt. Staff continue to monitor pt with q39min checks.  R: No adverse drug reactions noted. klonopin observed as effective in controlling pt's anxiety. Pt receptive to treatment. Pt remains safe at this time.

## 2012-12-15 NOTE — Progress Notes (Signed)
The focus of this group is to educate the patient on the purpose and policies of crisis stabilization and provide a format to answer questions about their admission.  The group details unit policies and expectations of patients while admitted. Patient did not attend, she stayed in bed and aslo mety with social worker at this time.

## 2012-12-15 NOTE — Progress Notes (Signed)
D: Pt mood is depressed but she brightens upon approach. She rates her depression a 2/10. She interacts well with her peers. A: Support given. Verbalization encouraged. Medications given as prescribed. Pt encouraged to come to nurse with any concerns.  R: Pt is receptive. No complaints of pain or discomfort at this time. Q15 min safety checks maintained. Will continue to monitor pt.

## 2012-12-15 NOTE — Progress Notes (Signed)
Adult Psychoeducational Group Note  Date:  12/15/2012 Time:  11:00am Group Topic/Focus:  Recovery Goals:   The focus of this group is to identify appropriate goals for recovery and establish a plan to achieve them.  Participation Level:  Minimal  Participation Quality:  Appropriate  Affect:  Appropriate  Cognitive:  Alert and Appropriate  Insight: Appropriate  Engagement in Group:  Poor  Modes of Intervention:  Discussion, Education and Reality Testing  Additional Comments:  Pt attended and participated in group. Discussion today was on Recovery. The question was asked what does recovery mean to you? Pt did not have much to say in group.  Shelly Bombard D 12/15/2012, 1:01 PM

## 2012-12-15 NOTE — Progress Notes (Signed)
Recreation Therapy Notes  Date: 11.11.2014 Time: 2:45pm Location: [address] Dayroom  Group Topic: Software engineer Activities (AAA)  Behavioral Response: Engaged, Attentive, Appropriate  Affect: Euthymic  Clinical Observations/Feedback: Dog Team: [employer]. Patient interacted appropriately with peer, dog team, LRT and MHT.   Marykay Lex Jakki Doughty, LRT/CTRS  Jearl Klinefelter 12/15/2012 5:16 PM

## 2012-12-15 NOTE — Progress Notes (Signed)
Adult Psychoeducational Group Note  Date:  12/15/2012 Time:  8:00 pm  Group Topic/Focus:  Wrap-Up Group:   The focus of this group is to help patients review their daily goal of treatment and discuss progress on daily workbooks.  Participation Level:  Active  Participation Quality:  Appropriate and Sharing  Affect:  Appropriate  Cognitive:  Appropriate  Insight: Appropriate  Engagement in Group:  Engaged  Modes of Intervention:  Discussion, Education, Socialization and Support  Additional Comments:  Pt stated that she is the hospital because she tried to hurt herself. Pt stated that she has a great heart and she is caring.   Geroge Gilliam 12/15/2012, 11:59 PM

## 2012-12-15 NOTE — [facility] Suicide Risk Assessment (Signed)
[facility] INPATIENT:  Family/Significant Other Suicide Prevention Education  Suicide Prevention Education:  Education Completed; Vergie Living - son [phone]),  (name of family member/significant other) has been identified by the patient as the family member/significant other with whom the patient will be residing, and identified as the person(s) who will aid the patient in the event of a mental health crisis (suicidal ideations/suicide attempt).  With written consent from the patient, the family member/significant other has been provided the following suicide prevention education, prior to the and/or following the discharge of the patient.  The suicide prevention education provided includes the following:  Suicide risk factors  Suicide prevention and interventions  National Suicide Hotline telephone number  [facility] assessment telephone number  [facility] Emergency Assistance 911  [facility] and/or Residential Mobile Crisis Unit telephone number  Request made of family/significant other to:  Remove weapons (e.g., guns, rifles, knives), all items previously/currently identified as safety concern.    Remove drugs/medications (over-the-counter, prescriptions, illicit drugs), all items previously/currently identified as a safety concern.  The family member/significant other verbalizes understanding of the suicide prevention education information provided.  The family member/significant other agrees to remove the items of safety concern listed above.  Carmina Miller 12/15/2012, 2:48 PM

## 2012-12-15 NOTE — [facility] Suicide Risk Assessment (Signed)
Suicide Risk Assessment  Admission Assessment     Nursing information obtained from:    Demographic factors:    Current Mental Status:    Loss Factors:    Historical Factors:    Risk Reduction Factors:     CLINICAL FACTORS:   Depression:   Impulsivity Insomnia  COGNITIVE FEATURES THAT CONTRIBUTE TO RISK:  Closed-mindedness Polarized thinking Thought constriction (tunnel vision)    SUICIDE RISK:   Moderate:  Frequent suicidal ideation with limited intensity, and duration, some specificity in terms of plans, no associated intent, good self-control, limited dysphoria/symptomatology, some risk factors present, and identifiable protective factors, including available and accessible social support.  PLAN OF CARE: Supportive approach/coping skills/relapse prevention                               Reassess and optimize treatment with psychotropics  I certify that inpatient services furnished can reasonably be expected to improve the patient's condition.  Thomasa Heidler A 12/15/2012, 5:14 PM
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
## 2012-12-15 NOTE — Progress Notes (Signed)
Nutrition Brief Note  Patient identified on the Malnutrition Screening Tool (MST) Report.  Wt Readings from Last 10 Encounters:  12/14/12 128 lb (58.06 kg)   Body mass index is 18.89 kg/(m^2). Patient meets criteria for normal weight based on current BMI.   Discussed intake PTA with patient and compared to intake presently.  Discussed changes in intake, if any, and encouraged adequate intake of meals and snacks. Pt reports always eating 1 meal/day at home. Reports excellent appetite/intake since admission which is causing her to go to the bathroom r/t having diarrhea. Reports 10-11 pound unintended weight loss in the past year. Agreeable to trying Ensure Complete at night. Pt feels better now that her nutritional status is improving. Says she has a lot of goals she wants to work on with 2 of them being eating healthier and stopping smoking. Encouragement provided.   Current diet order is regular and pt is also offered choice of unit snacks mid-morning and mid-afternoon.  Pt is eating as desired.   Labs and medications reviewed. Potassium low.   Nutrition Dx:  Unintended wt change r/t suboptimal oral intake AEB pt report  Interventions:   Discussed the importance of nutrition and encouraged intake of food and beverages.     Multivitamin 1 tablet PO daily   Supplements: Ensure Complete once daily  No additional nutrition interventions warranted at this time. If nutrition issues arise, please consult RD.   Levon Hedger MS, RD, LDN [phone] Pager [phone] After Hours Pager

## 2012-12-16 DIAGNOSIS — F411 Generalized anxiety disorder: Secondary | ICD-10-CM

## 2012-12-16 DIAGNOSIS — F332 Major depressive disorder, recurrent severe without psychotic features: Principal | ICD-10-CM

## 2012-12-16 DIAGNOSIS — F909 Attention-deficit hyperactivity disorder, unspecified type: Secondary | ICD-10-CM

## 2012-12-16 MED ORDER — CLONAZEPAM 1 MG PO TABS
1.0000 mg | ORAL_TABLET | Freq: Two times a day (BID) | ORAL | Status: DC
  Administered 2012-12-17 – 2012-12-18 (×3): 1 mg via ORAL
  Filled 2012-12-16 (×3): qty 1

## 2012-12-16 MED ORDER — CITALOPRAM HYDROBROMIDE 40 MG PO TABS
40.0000 mg | ORAL_TABLET | Freq: Every day | ORAL | Status: DC
  Administered 2012-12-17 – 2012-12-19 (×3): 40 mg via ORAL
  Filled 2012-12-16 (×6): qty 1

## 2012-12-16 MED ORDER — BUSPIRONE HCL 15 MG PO TABS
7.5000 mg | ORAL_TABLET | Freq: Two times a day (BID) | ORAL | Status: DC
  Administered 2012-12-16 – 2012-12-19 (×6): 7.5 mg via ORAL
  Filled 2012-12-16 (×11): qty 1

## 2012-12-16 NOTE — Progress Notes (Signed)
Adult Psychoeducational Group Note  Date:  12/16/2012 Time:  2:11 PM  Group Topic/Focus:  Crisis Planning:   The purpose of this group is to help patients create a crisis plan for use upon discharge or in the future, as needed.  Participation Level:  Minimal  Participation Quality:  Appropriate and Attentive  Affect:  Blunted  Cognitive:  Alert, Appropriate and Oriented  Insight: Good  Engagement in Group:  Developing/Improving and Improving  Modes of Intervention:  Discussion and Education  Additional Comments:  Pt attended morning Psychoeducational skills group with peers. Goal of group was to outline the aspects of an effective crisis plan. Pt was limited in her engagement in group, but was able to state she enjoys spending time with her children as a way to cope with stress.  Orma Render 12/16/2012, 2:11 PM
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
## 2012-12-16 NOTE — Progress Notes (Signed)
D: Patient denies SI/HI and A/V hallucinations; patient reports sleep is well; reports appetite is good; reports energy level is low ; reports ability to pay attention is poor; rates depression as 3/10; rates hopelessness 9/10; rates anxiety as 3/10; patient reports that she already has a plan in place for after discharge and reports that she wants to go home; patient reports that the Klonopin really does help keep her calm  A: Monitored q 15 minutes; patient encouraged to attend groups; patient educated about medications; patient given medications per physician orders; patient encouraged to express feelings and/or concerns  R: Patient is superficial and forwards little information; patient's interaction with staff and peers is appropriate; patient was able to set goal to talk with staff 1:1 when having feelings of SI; patient is taking medications as prescribed and tolerating medications; patient is attending all groups

## 2012-12-16 NOTE — BHH Group Notes (Signed)
[facility] LCSW Aftercare Discharge Planning Group Note   12/16/2012 8:45 AM  Participation Quality:  Alert and Appropriate   Mood/Affect:  Appropriate, Disorganized  Depression Rating:  0  Anxiety Rating:  0  Thoughts of Suicide:  Pt denies SI/HI  Will you contract for safety?   Yes  Current AVH:  Pt denies  Plan for Discharge/Comments:  Pt attended discharge planning group and actively participated in group.  CSW provided pt with today's workbook.  Pt states that she wants to go home.  Pt will return home in [place] and follow up at [facility] and medication management and therapy.  Pt states that she is also going to follow up at [facility] for additional support.  No further needs voiced by pt at this time.    Transportation Means: Pt reports access to transportation - family will pick pt up  Supports: No supports mentioned at this time  Reyes Ivan, LCSW 12/16/2012 9:55 AM

## 2012-12-16 NOTE — Progress Notes (Signed)
Adult Psychoeducational Group Note  Date:  12/16/2012 Time:  10:00am Group Topic/Focus:  Therapeutic Activity  Participation Level:  Active  Participation Quality:  Appropriate and Attentive  Affect:  Appropriate  Cognitive:  Alert and Appropriate  Insight: Appropriate  Engagement in Group:  Engaged  Modes of Intervention:  Discussion and Education  Additional Comments:  Pt attended and participated in group. Discussion was on telling of a happy time in your life.Pt stated her happy time was when her kids were born.   Shelly Bombard D 12/16/2012, 11:45 AM
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
## 2012-12-16 NOTE — Tx Team (Signed)
Interdisciplinary Treatment Plan Update (Adult)  Date: 12/16/2012  Time Reviewed:  9:45 AM  Progress in Treatment: Attending groups: Yes Participating in groups:  Yes Taking medication as prescribed:  Yes Tolerating medication:  Yes Family/Significant othe contact made: Yes Patient understands diagnosis:  Yes Discussing patient identified problems/goals with staff:  Yes Medical problems stabilized or resolved:  Yes Denies suicidal/homicidal ideation: Yes Issues/concerns per patient self-inventory:  Yes Other:  New problem(s) identified: N/A  Discharge Plan or Barriers: CSW assessing for appropriate referrals.  Reason for Continuation of Hospitalization: Anxiety Depression Medication Stabilization  Comments: N/A  Estimated length of stay: 3-5 days  For review of initial/current patient goals, please see plan of care.  Attendees: Patient:     Family:     Physician:  Dr. Javier Glazier 12/16/2012 10:11 AM   Nursing:   Harold Barban, RN 12/16/2012 10:11 AM   Clinical Social Worker:  Reyes Ivan, LCSW 12/16/2012 10:11 AM   Other: Verne Spurr, PA 12/16/2012 10:11 AM   Other:  Frankey Shown, MA care coordination 12/16/2012 10:11 AM   Other:  Juline Patch, LCSW 12/16/2012 10:11 AM   Other:  Burnetta Sabin, RN 12/16/2012 10:11 AM   Other: Marzetta Board, RN 12/16/2012 10:11 AM   Other: Onnie Boer, RN case manager 12/16/2012 10:11 AM   Other:    Other:    Other:    Other:     Scribe for Treatment Team:   Carmina Miller, 12/16/2012 10:11 AM

## 2012-12-16 NOTE — Progress Notes (Signed)
Adult Psychoeducational Group Note  Date:  12/16/2012 Time:  9:23 PM  Group Topic/Focus:  Wrap-Up Group:   The focus of this group is to help patients review their daily goal of treatment and discuss progress on daily workbooks.  Participation Level:  Active  Participation Quality:  Attentive  Affect:  Appropriate  Cognitive:  Appropriate  Insight: Appropriate  Engagement in Group:  Engaged  Modes of Intervention:  Support  Additional Comments:  Pt stated that she got to see her soon today who is of age 72. She says that he is her support system and that she had a good day interacting with her peers. Pt was given support to continue interacting and not secluding herself  Tenia Goh 12/16/2012, 9:23 PM

## 2012-12-17 NOTE — Progress Notes (Signed)
The focus of this group is to educate the patient on the purpose and policies of crisis stabilization and provide a format to answer questions about their admission.  The group details unit policies and expectations of patients while admitted.  Patient attended 0900 nurse education orientation group.  Patient actively participated, appropriate affect, alert, appropriate insight and engagement.  Today patient will work on goals for discharge.  

## 2012-12-17 NOTE — Progress Notes (Signed)
D: Pt mood is depressed. She interacts well within the milieu. She has been pleasant and cooperative. She states that she is ready for discharge and rates her depression 0/10. A: Support given. Verbalization encouraged. Medications given as prescribed. Pt encouraged to come to nurse with any concerns.  R: Pt is receptive. No complaints of pain or discomfort at this time. Q15 min safety checks maintained. Will continue to monitor pt.

## 2012-12-17 NOTE — Progress Notes (Signed)
Adult Psychoeducational Group Note  Date:  12/17/2012 Time:  8:00PM Group Topic/Focus:  Karaoke  Participation Level:  Active  Participation Quality:  Appropriate and Attentive  Affect:  Appropriate  Cognitive:  Alert and Appropriate  Insight: Appropriate  Engagement in Group:  Engaged  Modes of Intervention:  Socialization  Additional Comments:  Pt. Was appropriate and attentive during tonight's group. Pt attended Karaoke.  Bing Plume D 12/17/2012, 8:42 PM

## 2012-12-17 NOTE — Progress Notes (Signed)
D:  Patient's self inventory sheet, patient has fair sleep, good appetite, low energy level, poor attention span.  Rated depression 2, hopeless 1, denied anxiety.  Denied withdrawals.  Denied SI.  Has felt lightheaded and dizzy in past 24 hours.  After discharge, self help plan already in progress with her mental health [place] and [place] with Behavior Health.  No problems taking meds after discharge. A;   Medications administered per MD orders.  Emotional support and encouragement given patient. R:  Denied SI and HI.  Denied A/V hallucinations.  Denied pain.  Will continue to monitor patient for safety with 15 minute checks.  Safety maintained.

## 2012-12-17 NOTE — Progress Notes (Signed)
Recreation Therapy Notes  Date: 11.13.2014 Time: 2:45pm Location: [address] Dayroom  Group Topic: Animal Assisted Activities (AAA)  Behavioral Response: Engaged, Attentive, Appropriate   Affect: Euthymic  Clinical Observations/Feedback: Dog Team: Slate & handler. Patient interacted appropriately with peer, dog team, and LRT.   Shanora Christensen L Aashika Carta, LRT/CTRS  Teresina Bugaj L 12/17/2012 4:52 PM 
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
## 2012-12-17 NOTE — Progress Notes (Signed)
Adult Psychoeducational Group Note  Date:  12/17/2012 Time:  2:05 PM  Group Topic/Focus:  Self Esteem Action Plan:   The focus of this group is to help patients create a plan to continue to build self-esteem after discharge.  Participation Level:  Minimal  Participation Quality:  Appropriate and Attentive  Affect:  Appropriate  Cognitive:  Alert and Appropriate  Insight: Good  Engagement in Group:  Engaged  Modes of Intervention:  Activity, Discussion, Exploration, Socialization and Support  Additional Comments:  Pt came to group and did share when asked.  Pt completed a self-esteem action plan and discussed several activities and strategies to help develop a positive self-esteem. Pt was encouraged throughout the group to share the answers to the questions and get feedback and support from the group. Pt discussed positive things about themselves and made positive goals for the future.   Cathlean Cower 12/17/2012, 2:05 PM

## 2012-12-17 NOTE — [facility] Group Notes (Signed)
[facility] LCSW Group Therapy  12/17/2012  1:15 PM   Type of Therapy:  Group Therapy  Participation Level:  Active  Participation Quality:  Appropriate and Attentive  Affect:  Appropriate  Cognitive:  Alert and Appropriate  Insight:  Developing/Improving and Engaged  Engagement in Therapy:  Developing/Improving and Engaged  Modes of Intervention:  Clarification, Confrontation, Discussion, Education, Exploration, Limit-setting, Orientation, Problem-solving, Rapport Building, Dance movement psychotherapist, Socialization and Support  Summary of Progress/Problems: The topic for group was balance in life.  Today's group focused on defining balance in one's own words, identifying things that can knock one off balance, and exploring healthy ways to maintain balance in life. Group members were asked to provide an example of a time when they felt off balance, describe how they handled that situation,and process healthier ways to regain balance in the future. Group members were asked to share the most important tool for maintaining balance that they learned while at [facility] and how they plan to apply this method after discharge.   Pt shared that she has to learn to let go of her past and mistakes she's made, as this is what holds her back from getting better.  Pt states that she can't recall ever having a balanced life, as she reports being depressed even as a child.  Pt states that her goal to restore balance is to use the resources available to her.  Pt actively participated and was engaged in group discussion.    Nancy Ivan, LCSW 12/17/2012 2:01 PM

## 2012-12-17 NOTE — Progress Notes (Signed)
Recreation Therapy Notes  Date: 11.12.2014 Time: 3:00pm Location: [address] Dayroom    Group Topic: Communication, Team Building, Problem Solving  Goal Area(s) Addresses:  Patient will effectively work with peer towards shared goal.  Patient will identify skill used to make activity successful.  Patient will identify how skills used during activity can be used to reach post d/c goals.   Behavioral Response: Disengaged.   Intervention: Problem Solving Activity  Activity: Landing Pad. In teams patients were given 12 plastic drinking straws and a length of masking tape. Using the materials provided patients were asked to build a landing pad to catch a golf ball dropped from approximately 6 feet in the air.   Education: Special educational needs teacher, Team Work, Building control surveyor, Journalist, newspaper.   Education Outcome: Acknowledges understanding   Clinical Observations/Feedback: Patient attended group session, but had no participation. Patient was observed to cover herself with her jacket, close her eyes and lean her head on the back of her chair as if she was sleeping.   Marykay Lex Ivi Griffith, LRT/CTRS  Livianna Petraglia L 12/17/2012 8:16 AM

## 2012-12-18 MED ORDER — CLONAZEPAM 0.5 MG PO TABS
0.5000 mg | ORAL_TABLET | Freq: Every day | ORAL | Status: DC
  Administered 2012-12-18: 0.5 mg via ORAL
  Filled 2012-12-18: qty 1

## 2012-12-18 MED ORDER — CLONAZEPAM 0.5 MG PO TABS: 0.5000 mg | ORAL_TABLET | Freq: Two times a day (BID) | ORAL | Status: DC
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
## 2012-12-18 NOTE — Progress Notes (Signed)
D: pt. Is calm and cooperative. Pt. States that she just really wanted to go home today and it really stressed her out that she didn't. Denies SI/HI/AVH. Denies having any pain. Writer changed band aid on left wrist.  A: q 15 min safety checks. scheduled medication given. Support and encouragement offered. R: pt. Remains safe on the unit. No complaints or signs of distress.

## 2012-12-18 NOTE — Tx Team (Signed)
Interdisciplinary Treatment Plan Update (Adult)  Date: 12/18/2012  Time Reviewed:  9:45 AM  Progress in Treatment: Attending groups: Yes Participating in groups:  Yes Taking medication as prescribed:  Yes Tolerating medication:  Yes Family/Significant othe contact made: Yes Patient understands diagnosis:  Yes Discussing patient identified problems/goals with staff:  Yes Medical problems stabilized or resolved:  Yes Denies suicidal/homicidal ideation: Yes Issues/concerns per patient self-inventory:  Yes Other:  New problem(s) identified: N/A  Discharge Plan or Barriers: Pt has follow up scheduled at [facility] and [facility] for medication management and therapy.   Reason for Continuation of Hospitalization: Disorganized Medication Stabilization  Comments: N/A  Estimated length of stay: 1 day, d/c tomorrow  For review of initial/current patient goals, please see plan of care.  Attendees: Patient:     Family:     Physician:  Dr. Javier Glazier 12/18/2012 10:19 AM   Nursing:   Harold Barban, RN 12/18/2012 10:19 AM   Clinical Social Worker:  Reyes Ivan, LCSW 12/18/2012 10:19 AM   Other: Verne Spurr, PA 12/18/2012 10:19 AM   Other:  Frankey Shown, MA care coordination 12/18/2012 10:19 AM   Other:  Juline Patch, LCSW 12/18/2012 10:19 AM   Other:  Leighton Parody, RN 12/18/2012 10:20 AM   Other:    Other:    Other:    Other:    Other:    Other:     Scribe for Treatment Team:   Carmina Miller, 12/18/2012 10:19 AM

## 2012-12-18 NOTE — Progress Notes (Signed)
D: Pt presents bright in affect and anxious in mood. She endorsees a decrease in her anxiety and depression.  Pt attended group this evening. Pt observed interacting appropriately within the milieu. Pt is reporting readiness for discharge. She is denying any SI/HI/AVH.  A: Writer administered scheduled and prn medications to pt. Continued support and availability as needed was extended to this pt. Staff continue to monitor pt with q40min checks.  R: No adverse drug reactions noted. Pt receptive to treatment. Pt remains safe at this time.

## 2012-12-18 NOTE — [facility] Group Notes (Signed)
[facility] LCSW Group Therapy  12/18/2012 2:55 PM  Type of Therapy:  Group Therapy  Participation Level:  Minimal  Participation Quality:  Resistant  Affect:  Depressed and Irritable  Cognitive:  Lacking  Insight:  Limited  Engagement in Therapy:  Limited  Modes of Intervention:  Confrontation, Discussion, Education, Exploration, Socialization and Support  Summary of Progress/Problems: Feelings around Relapse. Group members discussed the meaning of relapse and shared personal stories of relapse, how it affected them and others, and how they perceived themselves during this time. Group members were encouraged to identify triggers, warning signs and coping skills used when facing the possibility of relapse. Social supports were discussed and explored in detail. Cintia was disengaged throughout the majority of group and was resistant to participation in group discussion. At this time, she demonstrates limited progress in the group setting AEB her resistance to active participation (unless called upon directly) and her irritable affect. Twanda shared that having a mental illness relapse made her feel "hopeless and no good." She did not elaborate on this statement or offer additional input during group discussion.    Smart, Ceilidh Torregrossa LCSWA 12/18/2012, 2:55 PM

## 2012-12-18 NOTE — [facility] Group Notes (Signed)
[facility] LCSW Aftercare Discharge Planning Group Note   12/18/2012 8:45 AM  Participation Quality:  Alert and Appropriate   Mood/Affect:  Appropriate and Calm  Depression Rating:  1  Anxiety Rating:  1  Thoughts of Suicide:  Pt denies SI/HI  Will you contract for safety?   Yes  Current AVH:  Pt denies  Plan for Discharge/Comments:  Pt attended discharge planning group and actively participated in group.  CSW provided pt with today's workbook.  Pt states that she wants to go home.  Pt states that she is tired today.  Pt will return home in [place] and follow up at [facility] and [facility] and medication management and therapy.  Pt states that she is also [facility].  No further needs voiced by pt at this time.    Transportation Means: Pt reports access to transportation - family will pick pt up  Supports: No supports mentioned at this time  Reyes Ivan, LCSW 12/18/2012 10:04 AM

## 2012-12-18 NOTE — Progress Notes (Signed)
(  Discharge Saturday) [facility] Adult Case Management Discharge Plan :  Will you be returning to the same living situation after discharge: Yes,  returning home At discharge, do you have transportation home?:Yes,  family will pick pt up Do you have the ability to pay for your medications:Yes,  access to meds  Release of information consent forms completed and in the chart;  Patient's signature needed at discharge.  Patient to Follow up at: Follow-up Information   Follow up with [facility] On 12/22/2012. (Appointment scheduled at 11:00 am with Boneta Lucks for therapy)    Contact information:   [address] [place], [place] [address] Phone: [phone]      Follow up with [facility] On 01/07/2013. (Appointment scheduled at 10:45 am with Dr. Evelene Croon for medication management)    Contact information:   [address] [place], [place] [address] Phone: [phone] Fax: [phone]      Patient denies SI/HI:   Yes,  denies SI/HI    Safety Planning and Suicide Prevention discussed:  Yes,  discussed with pt and pt's son.  See suicide prevention education note.   Nancy Mcintyre 12/18/2012, 10:23 AM

## 2012-12-18 NOTE — Progress Notes (Signed)
D:  Per pt self inventory pt reports sleeping well, appetite improving, energy level low, ability to pay attention improving, rates depression at a 3 out of 10 and hopelessness at a 3 out of 10, denies SI/HI/AVH at this time, denies pain, flat depressed today, feeling tired from the medications.     A:  Emotional support provided, Encouraged pt to continue with treatment plan and attend all group activities, q15 min checks maintained for safety.  R:  Pt is calm and cooperative with staff and other patients, attending groups.

## 2012-12-18 NOTE — Progress Notes (Signed)
[facility] Group Notes:  (Nursing/MHT/Case Management/Adjunct)  Date:  12/18/2012  Time:  8:00 p.m.  Type of Therapy:  Psychoeducational Skills  Participation Level:  Active  Participation Quality:  Attentive  Affect:  Irritable  Cognitive:  Appropriate  Insight:  Good  Engagement in Group:  Developing/Improving  Modes of Intervention:  Education  Summary of Progress/Problems: The patient expressed in group that she had a bad day since she was not discharged. She anticipates being discharged on either Saturday or Sunday. The patient mentioned that she was proud of the fact that she didn't take a nap today. As a theme for the day, her relapse prevention involves staying away from her "triggers".   Hazle Coca S 12/18/2012, 10:39 PM

## 2012-12-19 DIAGNOSIS — F988 Other specified behavioral and emotional disorders with onset usually occurring in childhood and adolescence: Secondary | ICD-10-CM

## 2012-12-19 DIAGNOSIS — F411 Generalized anxiety disorder: Secondary | ICD-10-CM

## 2012-12-19 MED ORDER — BUSPIRONE HCL 7.5 MG PO TABS: 7.5000 mg | ORAL_TABLET | Freq: Two times a day (BID) | ORAL | Status: DC

## 2012-12-19 MED ORDER — HYDROXYZINE HCL 50 MG PO TABS: 50.0000 mg | ORAL_TABLET | Freq: Three times a day (TID) | ORAL | Status: AC | PRN

## 2012-12-19 MED ORDER — CITALOPRAM HYDROBROMIDE 40 MG PO TABS: 20.0000 mg | ORAL_TABLET | Freq: Every day | ORAL | Status: DC

## 2012-12-19 MED ORDER — HYDROXYZINE HCL 50 MG PO TABS: 50.0000 mg | ORAL_TABLET | Freq: Three times a day (TID) | ORAL | Status: DC | PRN

## 2012-12-19 NOTE — [facility] Group Notes (Signed)
[facility] Group Notes: (Clinical Social Work)   12/19/2012      Type of Therapy:  Group Therapy   Participation Level:  Did Not Attend    Ambrose Mantle, LCSW 12/19/2012, 4:50 PM

## 2012-12-19 NOTE — Progress Notes (Signed)
D) Pt being discharged to home accompanied by a family member. Affect remains flat. Pt denies SI and HI. States she has wanted to leave since she has gotten here. States the only thing that she has learned is "I don't want to come back". Denies SI and HI, delusions and hallucinations. A) Given support, reassurance and appropriate praise. All belongings returned to Pt. Medications explained as well as follow up plans after discharge.  R) Denies SI and HI.
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
## 2012-12-19 NOTE — Progress Notes (Signed)
 .  Psychoeducational Group Note    Date: 12/19/2012 Time:  @T @    Goal Setting Purpose of Group: To be able to set a goal that is measurable and that can be accomplished in one day Participation Level:  Active  Participation Quality:  Appropriate  Affect:  Flat  Cognitive:  Alert  Insight:  Limited  Engagement in Group:  Engaged  Additional Comments:    Dione Housekeeper

## 2012-12-19 NOTE — [facility] Suicide Risk Assessment (Signed)
Suicide Risk Assessment  Discharge Assessment     Demographic Factors:  Adolescent or young adult, Low socioeconomic status and Unemployed  Mental Status Per Nursing Assessment::   On Admission:     Current Mental Status by Physician: Patient is calm and cooperative. Patient has fine mood with the appropriate affect. Patient has normal speech and thought process. Patient has been less focused, distracted occasionally. Patient has no suicide or homicidal ideation intentions or plans. Patient has no evidence of psychotic symptoms.  Loss Factors: Noncompliant with medications  Historical Factors: Prior suicide attempts, Family history of mental illness or substance abuse and Impulsivity  Risk Reduction Factors:   Sense of responsibility to family, Religious beliefs about death, Living with another person, especially a relative, Positive social support, Positive therapeutic relationship and Positive coping skills or problem solving skills  Continued Clinical Symptoms:  Severe Anxiety and/or Agitation Depression:   Recent sense of peace/wellbeing More than one psychiatric diagnosis Previous Psychiatric Diagnoses and Treatments Medical Diagnoses and Treatments/Surgeries  Cognitive Features That Contribute To Risk:  Polarized thinking    Suicide Risk:  Minimal: No identifiable suicidal ideation.  Patients presenting with no risk factors but with morbid ruminations; may be classified as minimal risk based on the severity of the depressive symptoms  Discharge Diagnoses:   AXIS I:  ADHD, inattentive type, Generalized Anxiety Disorder and Major Depression, Recurrent severe AXIS II:  Deferred AXIS III:   Past Medical History  Diagnosis Date  . Depression   . Anxiety   . ADD (attention deficit disorder)   . GERD (gastroesophageal reflux disease)   . Asthma    AXIS IV:  economic problems, occupational problems, other psychosocial or environmental problems, problems related to social  environment and problems with primary support group AXIS V:  61-70 mild symptoms  Plan Of Care/Follow-up recommendations:  Activity:  As tolerated Diet:  Regular  Is patient on multiple antipsychotic therapies at discharge:  No   Has Patient had three or more failed trials of antipsychotic monotherapy by history:  No  Recommended Plan for Multiple Antipsychotic Therapies: NA  Nancy Mcintyre,Nancy R. 12/19/2012, 12:45 PM

## 2012-12-19 NOTE — Progress Notes (Signed)
Psychoeducational Group Note  Date: 12/19/2012 Time:  1015  Group Topic/Focus:  Identifying Needs:   The focus of this group is to help patients identify their personal needs that have been historically problematic and identify healthy behaviors to address their needs.  Participation Level:  Active  Participation Quality:  Appropriate  Affect:  Flat  Cognitive:  Oriented  Insight:  Limited  Engagement in Group:  Engaged  Additional Comments:    Dione Housekeeper

## 2012-12-22 ENCOUNTER — Ambulatory Visit (HOSPITAL_COMMUNITY): Payer: Self-pay | Admitting: Psychiatry

## 2012-12-24 NOTE — Progress Notes (Signed)
Patient Discharge Instructions:  After Visit Summary (AVS):   Faxed to:  12/24/12 Discharge Summary Note:   Faxed to:  12/24/12 Psychiatric Admission Assessment Note:   Faxed to:  12/24/12 Suicide Risk Assessment - Discharge Assessment:   Faxed to:  12/24/12 Faxed/Sent to the Next Level Care provider:  12/24/12 Next Level Care Provider Has Access to the EMR, 12/24/12 Faxed to [facility] @ [phone] Records provided to [facility] via [facility]/Epic access.  Jerelene Redden, 12/24/2012, 2:04 PM

## 2012-12-29 ENCOUNTER — Ambulatory Visit (HOSPITAL_COMMUNITY): Payer: Self-pay | Admitting: Psychiatry

## 2013-01-05 ENCOUNTER — Ambulatory Visit (INDEPENDENT_AMBULATORY_CARE_PROVIDER_SITE_OTHER): Payer: BC Managed Care – PPO | Admitting: Psychiatry

## 2013-01-05 ENCOUNTER — Encounter (HOSPITAL_COMMUNITY): Payer: Self-pay | Admitting: Psychiatry

## 2013-01-05 DIAGNOSIS — G2581 Restless legs syndrome: Secondary | ICD-10-CM

## 2013-01-05 DIAGNOSIS — F411 Generalized anxiety disorder: Secondary | ICD-10-CM

## 2013-01-05 DIAGNOSIS — F339 Major depressive disorder, recurrent, unspecified: Secondary | ICD-10-CM

## 2013-01-05 NOTE — Progress Notes (Signed)
   THERAPIST PROGRESS NOTE  Session Time: 2:30-3:20  Participation Level: Active  Behavioral Response: CasualAlertAnxious  Type of Therapy: Individual Therapy  Treatment Goals addressed: emotion regulation  Interventions: CBT  Summary: Nancy Mcintyre is a 52 y.o. female who presents with major depressive disorder.   Suicidal/Homicidal: Nowithout intent/plan  Therapist Response: Pt. Presents as restless, tearful, and depressed. Pt. Presents following inpatient hospitalization after cutting to her wrists. Pt. Reports that she is not suicidal and does not want to die. Pt. Reports that she has been fixated on cutting for the past few weeks as release for emotional pain. Pt. Continues to be fixated on failed relationships and has significant difficulty discussing personal strengths. Pt. Reports dissatisfaction with current psychiatric care and requested referral to psychiatrist in this practice. Instructed Pt. On 4-7-8 breathing and to resume heartmath quick coherence practice as instructed in IOP.  Plan: Followed up with referral to Dr. Lolly Mustache per patient's request. Pt. To  Return again in 2 weeks.  Diagnosis: Axis I: Depressive Disorder NOS    Axis II: Borderline Personality Dis.    Wynonia Musty 01/05/2013

## 2013-01-14 ENCOUNTER — Ambulatory Visit (HOSPITAL_COMMUNITY): Payer: Self-pay | Admitting: Psychiatry

## 2013-01-21 ENCOUNTER — Ambulatory Visit (HOSPITAL_COMMUNITY): Payer: Self-pay | Admitting: Psychiatry

## 2013-04-07 ENCOUNTER — Encounter (HOSPITAL_COMMUNITY): Payer: Self-pay | Admitting: Psychiatry

## 2013-04-07 ENCOUNTER — Ambulatory Visit (INDEPENDENT_AMBULATORY_CARE_PROVIDER_SITE_OTHER): Payer: BC Managed Care – PPO | Admitting: Psychiatry

## 2013-04-07 VITALS — BP 120/78 | HR 88 | Ht 67.0 in | Wt 136.0 lb

## 2013-04-07 DIAGNOSIS — F411 Generalized anxiety disorder: Secondary | ICD-10-CM

## 2013-04-07 DIAGNOSIS — F332 Major depressive disorder, recurrent severe without psychotic features: Secondary | ICD-10-CM

## 2013-04-07 MED ORDER — ARIPIPRAZOLE 5 MG PO TABS: 5.0000 mg | ORAL_TABLET | Freq: Every day | ORAL | Status: AC

## 2013-04-07 MED ORDER — CITALOPRAM HYDROBROMIDE 40 MG PO TABS: 40.0000 mg | ORAL_TABLET | Freq: Every day | ORAL | Status: AC
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
## 2013-04-09 ENCOUNTER — Ambulatory Visit (HOSPITAL_COMMUNITY): Payer: Self-pay | Admitting: Psychiatry

## 2013-05-11 ENCOUNTER — Ambulatory Visit (HOSPITAL_COMMUNITY): Payer: Self-pay | Admitting: Psychiatry

## 2013-05-16 ENCOUNTER — Emergency Department (HOSPITAL_COMMUNITY)
Admission: EM | Admit: 2013-05-16 | Discharge: 2013-05-16 | Disposition: A | Discharge status: Home / Self Care | Payer: BC Managed Care – PPO | Source: Home / Self Care | Attending: Emergency Medicine | Admitting: Emergency Medicine

## 2013-05-16 ENCOUNTER — Encounter (HOSPITAL_COMMUNITY): Payer: Self-pay | Admitting: Emergency Medicine

## 2013-05-16 DIAGNOSIS — J019 Acute sinusitis, unspecified: Secondary | ICD-10-CM

## 2013-05-16 HISTORY — DX: Generalized anxiety disorder: F41.1

## 2013-05-16 LAB — POCT I-STAT, CHEM 8
BUN: 7 mg/dL (ref 6–23)
CHLORIDE: 102 meq/L (ref 96–112)
CREATININE: 1.1 mg/dL (ref 0.50–1.10)
Calcium, Ion: 1.18 mmol/L (ref 1.12–1.23)
GLUCOSE: 101 mg/dL — AB (ref 70–99)
HCT: 44 % (ref 36.0–46.0)
Hemoglobin: 15 g/dL (ref 12.0–15.0)
Potassium: 3.6 mEq/L — ABNORMAL LOW (ref 3.7–5.3)
Sodium: 142 mEq/L (ref 137–147)
TCO2: 29 mmol/L (ref 0–100)

## 2013-05-16 LAB — CBC WITH DIFFERENTIAL/PLATELET
BASOS ABS: 0 10*3/uL (ref 0.0–0.1)
BASOS PCT: 0 % (ref 0–1)
Eosinophils Absolute: 0.5 10*3/uL (ref 0.0–0.7)
Eosinophils Relative: 6 % — ABNORMAL HIGH (ref 0–5)
HCT: 39.3 % (ref 36.0–46.0)
HEMOGLOBIN: 13 g/dL (ref 12.0–15.0)
LYMPHS PCT: 30 % (ref 12–46)
Lymphs Abs: 2.6 10*3/uL (ref 0.7–4.0)
MCH: 24.3 pg — ABNORMAL LOW (ref 26.0–34.0)
MCHC: 33.1 g/dL (ref 30.0–36.0)
MCV: 73.3 fL — ABNORMAL LOW (ref 78.0–100.0)
Monocytes Absolute: 0.5 10*3/uL (ref 0.1–1.0)
Monocytes Relative: 5 % (ref 3–12)
NEUTROS ABS: 5.1 10*3/uL (ref 1.7–7.7)
Neutrophils Relative %: 58 % (ref 43–77)
Platelets: 239 10*3/uL (ref 150–400)
RBC: 5.36 MIL/uL — ABNORMAL HIGH (ref 3.87–5.11)
RDW: 14.7 % (ref 11.5–15.5)
WBC: 8.7 10*3/uL (ref 4.0–10.5)

## 2013-05-16 LAB — HEPATIC FUNCTION PANEL
ALBUMIN: 4 g/dL (ref 3.5–5.2)
ALT: 20 U/L (ref 0–35)
AST: 21 U/L (ref 0–37)
Alkaline Phosphatase: 83 U/L (ref 39–117)
Bilirubin, Direct: <0.2 mg/dL (ref 0.0–0.3)
Total Bilirubin: <0.2 mg/dL — ABNORMAL LOW (ref 0.3–1.2)
Total Protein: 7 g/dL (ref 6.0–8.3)

## 2013-05-16 MED ORDER — IBUPROFEN 800 MG PO TABS
ORAL_TABLET | ORAL | Status: AC
  Filled 2013-05-16: qty 1

## 2013-05-16 MED ORDER — TRAMADOL HCL 50 MG PO TABS: 100.0000 mg | ORAL_TABLET | Freq: Three times a day (TID) | ORAL | Status: AC | PRN

## 2013-05-16 MED ORDER — IBUPROFEN 800 MG PO TABS
800.0000 mg | ORAL_TABLET | Freq: Once | ORAL | Status: AC
  Administered 2013-05-16: 800 mg via ORAL

## 2013-05-16 MED ORDER — FLUTICASONE PROPIONATE 50 MCG/ACT NA SUSP: 2.0000 | Freq: Every day | NASAL | Status: AC

## 2013-05-16 MED ORDER — AMOXICILLIN-POT CLAVULANATE 875-125 MG PO TABS: 1.0000 | ORAL_TABLET | Freq: Two times a day (BID) | ORAL | Status: AC

## 2013-05-16 NOTE — Discharge Instructions (Signed)
Headaches, Frequently Asked Questions °MIGRAINE HEADACHES °Q: What is migraine? What causes it? How can I treat it? °A: Generally, migraine headaches begin as a dull ache. Then they develop into a constant, throbbing, and pulsating pain. You may experience pain at the temples. You may experience pain at the front or back of one or both sides of the head. The pain is usually accompanied by a combination of: °· Nausea. °· Vomiting. °· Sensitivity to light and noise. °Some people (about 15%) experience an aura (see below) before an attack. The cause of migraine is believed to be chemical reactions in the brain. Treatment for migraine may include over-the-counter or prescription medications. It may also include self-help techniques. These include relaxation training and biofeedback.  °Q: What is an aura? °A: About 15% of people with migraine get an "aura". This is a sign of neurological symptoms that occur before a migraine headache. You may see wavy or jagged lines, dots, or flashing lights. You might experience tunnel vision or blind spots in one or both eyes. The aura can include visual or auditory hallucinations (something imagined). It may include disruptions in smell (such as strange odors), taste or touch. Other symptoms include: °· Numbness. °· A "pins and needles" sensation. °· Difficulty in recalling or speaking the correct word. °These neurological events may last as long as 60 minutes. These symptoms will fade as the headache begins. °Q: What is a trigger? °A: Certain physical or environmental factors can lead to or "trigger" a migraine. These include: °· Foods. °· Hormonal changes. °· Weather. °· Stress. °It is important to remember that triggers are different for everyone. To help prevent migraine attacks, you need to figure out which triggers affect you. Keep a headache diary. This is a good way to track triggers. The diary will help you talk to your healthcare professional about your condition. °Q: Does  weather affect migraines? °A: Bright sunshine, hot, humid conditions, and drastic changes in barometric pressure may lead to, or "trigger," a migraine attack in some people. But studies have shown that weather does not act as a trigger for everyone with migraines. °Q: What is the link between migraine and hormones? °A: Hormones start and regulate many of your body's functions. Hormones keep your body in balance within a constantly changing environment. The levels of hormones in your body are unbalanced at times. Examples are during menstruation, pregnancy, or menopause. That can lead to a migraine attack. In fact, about three quarters of all women with migraine report that their attacks are related to the menstrual cycle.  °Q: Is there an increased risk of stroke for migraine sufferers? °A: The likelihood of a migraine attack causing a stroke is very remote. That is not to say that migraine sufferers cannot have a stroke associated with their migraines. In persons under age 40, the most common associated factor for stroke is migraine headache. But over the course of a person's normal life span, the occurrence of migraine headache may actually be associated with a reduced risk of dying from cerebrovascular disease due to stroke.  °Q: What are acute medications for migraine? °A: Acute medications are used to treat the pain of the headache after it has started. Examples over-the-counter medications, NSAIDs, ergots, and triptans.  °Q: What are the triptans? °A: Triptans are the newest class of abortive medications. They are specifically targeted to treat migraine. Triptans are vasoconstrictors. They moderate some chemical reactions in the brain. The triptans work on receptors in your brain. Triptans help   to restore the balance of a neurotransmitter called serotonin. Fluctuations in levels of serotonin are thought to be a main cause of migraine.  °Q: Are over-the-counter medications for migraine effective? °A:  Over-the-counter, or "OTC," medications may be effective in relieving mild to moderate pain and associated symptoms of migraine. But you should see your caregiver before beginning any treatment regimen for migraine.  °Q: What are preventive medications for migraine? °A: Preventive medications for migraine are sometimes referred to as "prophylactic" treatments. They are used to reduce the frequency, severity, and length of migraine attacks. Examples of preventive medications include antiepileptic medications, antidepressants, beta-blockers, calcium channel blockers, and NSAIDs (nonsteroidal anti-inflammatory drugs). °Q: Why are anticonvulsants used to treat migraine? °A: During the past few years, there has been an increased interest in antiepileptic drugs for the prevention of migraine. They are sometimes referred to as "anticonvulsants". Both epilepsy and migraine may be caused by similar reactions in the brain.  °Q: Why are antidepressants used to treat migraine? °A: Antidepressants are typically used to treat people with depression. They may reduce migraine frequency by regulating chemical levels, such as serotonin, in the brain.  °Q: What alternative therapies are used to treat migraine? °A: The term "alternative therapies" is often used to describe treatments considered outside the scope of conventional Western medicine. Examples of alternative therapy include acupuncture, acupressure, and yoga. Another common alternative treatment is herbal therapy. Some herbs are believed to relieve headache pain. Always discuss alternative therapies with your caregiver before proceeding. Some herbal products contain arsenic and other toxins. °TENSION HEADACHES °Q: What is a tension-type headache? What causes it? How can I treat it? °A: Tension-type headaches occur randomly. They are often the result of temporary stress, anxiety, fatigue, or anger. Symptoms include soreness in your temples, a tightening band-like sensation  around your head (a "vice-like" ache). Symptoms can also include a pulling feeling, pressure sensations, and contracting head and neck muscles. The headache begins in your forehead, temples, or the back of your head and neck. Treatment for tension-type headache may include over-the-counter or prescription medications. Treatment may also include self-help techniques such as relaxation training and biofeedback. °CLUSTER HEADACHES °Q: What is a cluster headache? What causes it? How can I treat it? °A: Cluster headache gets its name because the attacks come in groups. The pain arrives with little, if any, warning. It is usually on one side of the head. A tearing or bloodshot eye and a runny nose on the same side of the headache may also accompany the pain. Cluster headaches are believed to be caused by chemical reactions in the brain. They have been described as the most severe and intense of any headache type. Treatment for cluster headache includes prescription medication and oxygen. °SINUS HEADACHES °Q: What is a sinus headache? What causes it? How can I treat it? °A: When a cavity in the bones of the face and skull (a sinus) becomes inflamed, the inflammation will cause localized pain. This condition is usually the result of an allergic reaction, a tumor, or an infection. If your headache is caused by a sinus blockage, such as an infection, you will probably have a fever. An x-ray will confirm a sinus blockage. Your caregiver's treatment might include antibiotics for the infection, as well as antihistamines or decongestants.  °REBOUND HEADACHES °Q: What is a rebound headache? What causes it? How can I treat it? °A: A pattern of taking acute headache medications too often can lead to a condition known as "rebound headache."   treatment might include antibiotics for the infection, as well as antihistamines or decongestants.   REBOUND HEADACHES  Q: What is a rebound headache? What causes it? How can I treat it?  A: A pattern of taking acute headache medications too often can lead to a condition known as "rebound headache." A pattern of taking too much headache medication includes taking it more than 2 days per week or in excessive amounts. That means more than the label or a caregiver advises. With rebound  headaches, your medications not only stop relieving pain, they actually begin to cause headaches. Doctors treat rebound headache by tapering the medication that is being overused. Sometimes your caregiver will gradually substitute a different type of treatment or medication. Stopping may be a challenge. Regularly overusing a medication increases the potential for serious side effects. Consult a caregiver if you regularly use headache medications more than 2 days per week or more than the label advises.  ADDITIONAL QUESTIONS AND ANSWERS  Q: What is biofeedback?  A: Biofeedback is a self-help treatment. Biofeedback uses special equipment to monitor your body's involuntary physical responses. Biofeedback monitors:   Breathing.   Pulse.   Heart rate.   Temperature.   Muscle tension.   Brain activity.  Biofeedback helps you refine and perfect your relaxation exercises. You learn to control the physical responses that are related to stress. Once the technique has been mastered, you do not need the equipment any more.  Q: Are headaches hereditary?  A: Four out of five (80%) of people that suffer report a family history of migraine. Scientists are not sure if this is genetic or a family predisposition. Despite the uncertainty, a child has a 50% chance of having migraine if one parent suffers. The child has a 75% chance if both parents suffer.   Q: Can children get headaches?  A: By the time they reach high school, most young people have experienced some type of headache. Many safe and effective approaches or medications can prevent a headache from occurring or stop it after it has begun.   Q: What type of doctor should I see to diagnose and treat my headache?  A: Start with your primary caregiver. Discuss his or her experience and approach to headaches. Discuss methods of classification, diagnosis, and treatment. Your caregiver may decide to recommend you to a headache specialist, depending upon your symptoms or other  physical conditions. Having diabetes, allergies, etc., may require a more comprehensive and inclusive approach to your headache. The National Head[employer] Foundation will provide, upon request, a list of NHF physician members in your state.  Document Released: 04/13/2003 Document Revised: 04/15/2011 Document Reviewed: 09/21/2007  Exit[employer] Patient Information 2014 [employer].    Sinusitis  Sinusitis is redness, soreness, and swelling (inflammation) of the paranasal sinuses. Paranasal sinuses are air pockets within the bones of your face (beneath the eyes, the middle of the forehead, or above the eyes). In healthy paranasal sinuses, mucus is able to drain out, and air is able to circulate through them by way of your nose. However, when your paranasal sinuses are inflamed, mucus and air can become trapped. This can allow bacteria and other germs to grow and cause infection.  Sinusitis can develop quickly and last only a short time (acute) or continue over a long period (chronic). Sinusitis that lasts for more than 12 weeks is considered chronic.   CAUSES   Causes of sinusitis include:   Allergies.   Structural abnormalities, such as displacement of the   cartilage that separates your nostrils (deviated septum), which can decrease the air flow through your nose and sinuses and affect sinus drainage.   Functional abnormalities, such as when the small hairs (cilia) that line your sinuses and help remove mucus do not work properly or are not present.  SYMPTOMS   Symptoms of acute and chronic sinusitis are the same. The primary symptoms are pain and pressure around the affected sinuses. Other symptoms include:   Upper toothache.   Earache.   Headache.   Bad breath.   Decreased sense of smell and taste.   A cough, which worsens when you are lying flat.   Fatigue.   Fever.   Thick drainage from your nose, which often is green and may contain pus (purulent).   Swelling and warmth over the affected  sinuses.  DIAGNOSIS   Your caregiver will perform a physical exam. During the exam, your caregiver may:   Look in your nose for signs of abnormal growths in your nostrils (nasal polyps).   Tap over the affected sinus to check for signs of infection.   View the inside of your sinuses (endoscopy) with a special imaging device with a light attached (endoscope), which is inserted into your sinuses.  If your caregiver suspects that you have chronic sinusitis, one or more of the following tests may be recommended:   Allergy tests.   Nasal culture A sample of mucus is taken from your nose and sent to a lab and screened for bacteria.   Nasal cytology A sample of mucus is taken from your nose and examined by your caregiver to determine if your sinusitis is related to an allergy.  TREATMENT   Most cases of acute sinusitis are related to a viral infection and will resolve on their own within 10 days. Sometimes medicines are prescribed to help relieve symptoms (pain medicine, decongestants, nasal steroid sprays, or saline sprays).   However, for sinusitis related to a bacterial infection, your caregiver will prescribe antibiotic medicines. These are medicines that will help kill the bacteria causing the infection.   Rarely, sinusitis is caused by a fungal infection. In theses cases, your caregiver will prescribe antifungal medicine.  For some cases of chronic sinusitis, surgery is needed. Generally, these are cases in which sinusitis recurs more than 3 times per year, despite other treatments.  HOME CARE INSTRUCTIONS    Drink plenty of water. Water helps thin the mucus so your sinuses can drain more easily.   Use a humidifier.   Inhale steam 3 to 4 times a day (for example, sit in the bathroom with the shower running).   Apply a warm, moist washcloth to your face 3 to 4 times a day, or as directed by your caregiver.   Use saline nasal sprays to help moisten and clean your sinuses.   Take over-the-counter or  prescription medicines for pain, discomfort, or fever only as directed by your caregiver.  SEEK IMMEDIATE MEDICAL CARE IF:   You have increasing pain or severe headaches.   You have nausea, vomiting, or drowsiness.   You have swelling around your face.   You have vision problems.   You have a stiff neck.   You have difficulty breathing.  MAKE SURE YOU:    Understand these instructions.   Will watch your condition.   Will get help right away if you are not doing well or get worse.  Document Released: 01/21/2005 Document Revised: 04/15/2011 Document Reviewed: 02/05/2011  [employer] Patient Information 2014

## 2013-05-16 NOTE — ED Notes (Signed)
C/O intermittent headaches with nausea that are "debilitating" over past 4-5 days.  Goes away eventually with 800mg  IBU, but then returns.  C/O general malaise.  Also c/o temps of 99.0-100.

## 2013-05-16 NOTE — ED Provider Notes (Signed)
Chief Complaint   Chief Complaint  Patient presents with  . Headache    History of Present Illness   Nancy Mcintyre is a 53 year old female who's had a five-day history of a generalized, global headache that comes and goes. This is non-throbbing. It's not the worst headache of her life. She's also had low-grade fever of 99-100, congestion her right ear, tightness in her chest, has felt nauseated, and had some lower back pain. She denies any nasal congestion, rhinorrhea, sore throat, coughing, or wheezing. She denies any abdominal pain. She's had no diplopia, blurry vision, paresthesias, weakness, or difficulty with speech or ambulation. No prior history of headaches or migraines. No nausea, vomiting, photophobia, or phonophobia.  Review of Systems   Other than as noted above, the patient denies any of the following symptoms: Systemic:  No fever, chills, photophobia, or stiff neck. Eye:  No blurred vision, or diplopia. ENT:  No nasal congestion, rhinorrhea, sinus pressure or pain, or sore throat.  No jaw claudication. Neuro:  No paresthesias, loss of consciousness, seizure activity, muscle weakness, trouble with coordination or gait, trouble speaking or swallowing. Psych:  No depression, anxiety or trouble sleeping.  PMFSH   Past medical history, family history, social history, meds, and allergies were reviewed.    Physical Examination    Vital signs:  BP 127/88  Pulse 77  Temp(Src) 98.7 F (37.1 C) (Oral)  Resp 18  SpO2 99% General:  Alert and oriented.  In no distress. Eye:  Lids and conjunctivas normal.  PERRL,  Full EOMs.  Fundi benign with normal discs and vessels. ENT:  No cranial or facial tenderness to palpation.  TMs and canals clear.  Nasal mucosa was normal and uncongested without any drainage. No intra oral lesions, pharynx clear, mucous membranes moist, dentition normal. Neck:  Supple, full ROM, no tenderness to palpation.  No adenopathy or mass. Lungs: Clear to  auscultation. Heart: Regular rhythm, no gallop or murmur. Abdomen: Soft and nontender without organomegaly or mass. Neuro:  Alert and orented times 3.  Speech was clear, fluent, and appropriate.  Cranial nerves intact. No pronator drift, muscle strength normal. Finger to nose normal.  DTRs were 2+ and symmetrical.Station and gait were normal.  Romberg's sign was normal.  Able to perform tandem gait well. Psych:  Normal affect.   Labs   Results for orders placed during the hospital encounter of 05/16/13  CBC WITH DIFFERENTIAL      Result Value Ref Range   WBC 8.7  4.0 - 10.5 K/uL   RBC 5.36 (*) 3.87 - 5.11 MIL/uL   Hemoglobin 13.0  12.0 - 15.0 g/dL   HCT 86.539.3  78.436.0 - 69.646.0 %   MCV 73.3 (*) 78.0 - 100.0 fL   MCH 24.3 (*) 26.0 - 34.0 pg   MCHC 33.1  30.0 - 36.0 g/dL   RDW 29.514.7  28.411.5 - 13.215.5 %   Platelets 239  150 - 400 K/uL   Neutrophils Relative % 58  43 - 77 %   Neutro Abs 5.1  1.7 - 7.7 K/uL   Lymphocytes Relative 30  12 - 46 %   Lymphs Abs 2.6  0.7 - 4.0 K/uL   Monocytes Relative 5  3 - 12 %   Monocytes Absolute 0.5  0.1 - 1.0 K/uL   Eosinophils Relative 6 (*) 0 - 5 %   Eosinophils Absolute 0.5  0.0 - 0.7 K/uL   Basophils Relative 0  0 - 1 %   Basophils  Absolute 0.0  0.0 - 0.1 K/uL  HEPATIC FUNCTION PANEL      Result Value Ref Range   Total Protein 7.0  6.0 - 8.3 g/dL   Albumin 4.0  3.5 - 5.2 g/dL   AST 21  0 - 37 U/L   ALT 20  0 - 35 U/L   Alkaline Phosphatase 83  39 - 117 U/L   Total Bilirubin <0.2 (*) 0.3 - 1.2 mg/dL   Bilirubin, Direct <1.6  0.0 - 0.3 mg/dL   Indirect Bilirubin NOT CALCULATED  0.3 - 0.9 mg/dL  POCT I-STAT, CHEM 8      Result Value Ref Range   Sodium 142  137 - 147 mEq/L   Potassium 3.6 (*) 3.7 - 5.3 mEq/L   Chloride 102  96 - 112 mEq/L   BUN 7  6 - 23 mg/dL   Creatinine, Ser 1.09  0.50 - 1.10 mg/dL   Glucose, Bld 604 (*) 70 - 99 mg/dL   Calcium, Ion 5.40  9.81 - 1.23 mmol/L   TCO2 29  0 - 100 mmol/L   Hemoglobin 15.0  12.0 - 15.0 g/dL   HCT 19.1   47.8 - 29.5 %   Course in [facility]gent [facility]     She was given ibuprofen 800 mg by mouth.  Assessment   The encounter diagnosis was Acute sinusitis.  Differential diagnosis is viral syndrome versus sinusitis. Nothing to suggest meningitis or migraine headaches.  Plan   1.  Meds:  The following meds were prescribed:   Discharge Medication List as of 05/16/2013  6:59 PM    START taking these medications   Details  amoxicillin-clavulanate (AUGMENTIN) 875-125 MG per tablet Take 1 tablet by mouth 2 (two) times daily., Starting 05/16/2013, Until Discontinued, Normal    fluticasone (FLONASE) 50 MCG/ACT nasal spray Place 2 sprays into both nostrils daily., Starting 05/16/2013, Until Discontinued, Normal    traMADol (ULTRAM) 50 MG tablet Take 2 tablets (100 mg total) by mouth every 8 (eight) hours as needed., Starting 05/16/2013, Until Discontinued, Normal        2.  Patient Education/Counseling:  The patient was given appropriate handouts, self care instructions, and instructed in symptomatic relief.    3.  Follow up:  The patient was told to follow up here if no better in 3 to 4 days, or sooner if becoming worse in any way, and given some red flag symptoms such as fever, worsening pain, persistent vomiting, or new neurological symptoms which would prompt immediate return.       Reuben Likes, MD 05/16/13 2059

## 2013-05-16 NOTE — ED Notes (Signed)
Crackers provided.

## 2013-05-28 ENCOUNTER — Ambulatory Visit (HOSPITAL_COMMUNITY): Payer: Self-pay | Admitting: Psychiatry

## 2013-06-10 ENCOUNTER — Ambulatory Visit (HOSPITAL_COMMUNITY): Payer: Self-pay | Admitting: Psychiatry

## 2013-10-06 ENCOUNTER — Other Ambulatory Visit: Payer: Self-pay | Admitting: Gastroenterology

## 2013-10-06 DIAGNOSIS — R140 Abdominal distension (gaseous): Secondary | ICD-10-CM

## 2013-10-14 ENCOUNTER — Ambulatory Visit
Admission: RE | Admit: 2013-10-14 | Discharge: 2013-10-14 | Disposition: A | Discharge status: Home / Self Care | Payer: No Typology Code available for payment source | Source: Ambulatory Visit | Attending: Gastroenterology | Admitting: Gastroenterology

## 2013-10-14 DIAGNOSIS — R140 Abdominal distension (gaseous): Secondary | ICD-10-CM

## 2014-02-14 ENCOUNTER — Other Ambulatory Visit: Payer: Self-pay | Admitting: Obstetrics & Gynecology

## 2014-02-14 ENCOUNTER — Other Ambulatory Visit (HOSPITAL_COMMUNITY)
Admission: RE | Admit: 2014-02-14 | Discharge: 2014-02-14 | Disposition: A | Discharge status: Home / Self Care | Payer: Medicaid Other | Source: Ambulatory Visit | Attending: Obstetrics & Gynecology | Admitting: Obstetrics & Gynecology

## 2014-02-14 DIAGNOSIS — Z113 Encounter for screening for infections with a predominantly sexual mode of transmission: Secondary | ICD-10-CM | POA: Diagnosis present

## 2014-02-14 DIAGNOSIS — Z1151 Encounter for screening for human papillomavirus (HPV): Secondary | ICD-10-CM | POA: Insufficient documentation

## 2014-02-14 DIAGNOSIS — Z01419 Encounter for gynecological examination (general) (routine) without abnormal findings: Secondary | ICD-10-CM | POA: Diagnosis present

## 2014-02-15 LAB — CYTOLOGY - PAP

## 2014-06-16 ENCOUNTER — Other Ambulatory Visit: Payer: Self-pay | Admitting: Internal Medicine

## 2014-06-16 ENCOUNTER — Ambulatory Visit
Admission: RE | Admit: 2014-06-16 | Discharge: 2014-06-16 | Disposition: A | Discharge status: Home / Self Care | Payer: Medicaid Other | Source: Ambulatory Visit | Attending: Internal Medicine | Admitting: Internal Medicine

## 2014-06-16 DIAGNOSIS — R0609 Other forms of dyspnea: Principal | ICD-10-CM

## 2016-02-16 ENCOUNTER — Other Ambulatory Visit: Payer: Self-pay | Admitting: Obstetrics & Gynecology

## 2016-02-16 DIAGNOSIS — N761 Subacute and chronic vaginitis: Secondary | ICD-10-CM | POA: Diagnosis not present

## 2016-02-16 DIAGNOSIS — Z1231 Encounter for screening mammogram for malignant neoplasm of breast: Secondary | ICD-10-CM

## 2016-03-05 ENCOUNTER — Ambulatory Visit
Admission: RE | Admit: 2016-03-05 | Discharge: 2016-03-05 | Disposition: A | Discharge status: Home / Self Care | Payer: Medicare HMO | Source: Ambulatory Visit | Attending: Obstetrics & Gynecology | Admitting: Obstetrics & Gynecology

## 2016-03-05 DIAGNOSIS — Z1231 Encounter for screening mammogram for malignant neoplasm of breast: Secondary | ICD-10-CM | POA: Diagnosis not present

## 2016-03-11 DIAGNOSIS — G5603 Carpal tunnel syndrome, bilateral upper limbs: Secondary | ICD-10-CM | POA: Diagnosis not present

## 2016-03-11 DIAGNOSIS — Z23 Encounter for immunization: Secondary | ICD-10-CM | POA: Diagnosis not present

## 2016-03-11 DIAGNOSIS — F3341 Major depressive disorder, recurrent, in partial remission: Secondary | ICD-10-CM | POA: Diagnosis not present

## 2016-03-11 DIAGNOSIS — J4520 Mild intermittent asthma, uncomplicated: Secondary | ICD-10-CM | POA: Diagnosis not present

## 2016-03-11 DIAGNOSIS — Z136 Encounter for screening for cardiovascular disorders: Secondary | ICD-10-CM | POA: Diagnosis not present

## 2016-03-11 DIAGNOSIS — K219 Gastro-esophageal reflux disease without esophagitis: Secondary | ICD-10-CM | POA: Diagnosis not present

## 2016-03-11 DIAGNOSIS — Z79899 Other long term (current) drug therapy: Secondary | ICD-10-CM | POA: Diagnosis not present

## 2016-03-11 DIAGNOSIS — Z0000 Encounter for general adult medical examination without abnormal findings: Secondary | ICD-10-CM | POA: Diagnosis not present

## 2016-08-02 DIAGNOSIS — N761 Subacute and chronic vaginitis: Secondary | ICD-10-CM | POA: Diagnosis not present

## 2016-08-02 DIAGNOSIS — R350 Frequency of micturition: Secondary | ICD-10-CM | POA: Diagnosis not present

## 2016-08-06 DIAGNOSIS — R311 Benign essential microscopic hematuria: Secondary | ICD-10-CM | POA: Diagnosis not present

## 2016-08-06 DIAGNOSIS — R1012 Left upper quadrant pain: Secondary | ICD-10-CM | POA: Diagnosis not present

## 2016-09-13 ENCOUNTER — Ambulatory Visit (INDEPENDENT_AMBULATORY_CARE_PROVIDER_SITE_OTHER): Payer: Medicare HMO | Admitting: Licensed Clinical Social Worker

## 2016-09-13 DIAGNOSIS — F331 Major depressive disorder, recurrent, moderate: Secondary | ICD-10-CM

## 2016-09-23 ENCOUNTER — Ambulatory Visit (INDEPENDENT_AMBULATORY_CARE_PROVIDER_SITE_OTHER): Payer: Medicare HMO | Admitting: Licensed Clinical Social Worker

## 2016-09-23 ENCOUNTER — Ambulatory Visit: Payer: Self-pay | Admitting: Licensed Clinical Social Worker

## 2016-09-23 DIAGNOSIS — F331 Major depressive disorder, recurrent, moderate: Secondary | ICD-10-CM | POA: Diagnosis not present

## 2016-10-25 DIAGNOSIS — R69 Illness, unspecified: Secondary | ICD-10-CM | POA: Diagnosis not present

## 2016-11-04 ENCOUNTER — Ambulatory Visit: Payer: Self-pay | Admitting: Licensed Clinical Social Worker

## 2016-12-27 DIAGNOSIS — M722 Plantar fascial fibromatosis: Secondary | ICD-10-CM | POA: Diagnosis not present

## 2016-12-27 DIAGNOSIS — M71572 Other bursitis, not elsewhere classified, left ankle and foot: Secondary | ICD-10-CM | POA: Diagnosis not present

## 2016-12-27 DIAGNOSIS — M7732 Calcaneal spur, left foot: Secondary | ICD-10-CM | POA: Diagnosis not present

## 2016-12-27 DIAGNOSIS — M7731 Calcaneal spur, right foot: Secondary | ICD-10-CM | POA: Diagnosis not present

## 2016-12-27 DIAGNOSIS — M71571 Other bursitis, not elsewhere classified, right ankle and foot: Secondary | ICD-10-CM | POA: Diagnosis not present

## 2017-01-03 DIAGNOSIS — M71572 Other bursitis, not elsewhere classified, left ankle and foot: Secondary | ICD-10-CM | POA: Diagnosis not present

## 2017-01-03 DIAGNOSIS — M71571 Other bursitis, not elsewhere classified, right ankle and foot: Secondary | ICD-10-CM | POA: Diagnosis not present

## 2017-01-03 DIAGNOSIS — M76821 Posterior tibial tendinitis, right leg: Secondary | ICD-10-CM | POA: Diagnosis not present

## 2017-01-03 DIAGNOSIS — M722 Plantar fascial fibromatosis: Secondary | ICD-10-CM | POA: Diagnosis not present

## 2017-01-10 DIAGNOSIS — M722 Plantar fascial fibromatosis: Secondary | ICD-10-CM | POA: Diagnosis not present

## 2017-01-10 DIAGNOSIS — M71571 Other bursitis, not elsewhere classified, right ankle and foot: Secondary | ICD-10-CM | POA: Diagnosis not present

## 2017-01-10 DIAGNOSIS — M71572 Other bursitis, not elsewhere classified, left ankle and foot: Secondary | ICD-10-CM | POA: Diagnosis not present

## 2017-01-24 DIAGNOSIS — M71572 Other bursitis, not elsewhere classified, left ankle and foot: Secondary | ICD-10-CM | POA: Diagnosis not present

## 2017-01-24 DIAGNOSIS — M722 Plantar fascial fibromatosis: Secondary | ICD-10-CM | POA: Diagnosis not present

## 2017-01-24 DIAGNOSIS — M71571 Other bursitis, not elsewhere classified, right ankle and foot: Secondary | ICD-10-CM | POA: Diagnosis not present

## 2017-03-11 DIAGNOSIS — Z0000 Encounter for general adult medical examination without abnormal findings: Secondary | ICD-10-CM | POA: Diagnosis not present

## 2017-03-11 DIAGNOSIS — Z1389 Encounter for screening for other disorder: Secondary | ICD-10-CM | POA: Diagnosis not present

## 2017-03-11 DIAGNOSIS — Z23 Encounter for immunization: Secondary | ICD-10-CM | POA: Diagnosis not present

## 2017-03-11 DIAGNOSIS — Z136 Encounter for screening for cardiovascular disorders: Secondary | ICD-10-CM | POA: Diagnosis not present

## 2017-03-11 DIAGNOSIS — R5382 Chronic fatigue, unspecified: Secondary | ICD-10-CM | POA: Diagnosis not present

## 2017-03-11 DIAGNOSIS — R718 Other abnormality of red blood cells: Secondary | ICD-10-CM | POA: Diagnosis not present

## 2017-04-14 DIAGNOSIS — Z01411 Encounter for gynecological examination (general) (routine) with abnormal findings: Secondary | ICD-10-CM | POA: Diagnosis not present

## 2017-04-14 DIAGNOSIS — N898 Other specified noninflammatory disorders of vagina: Secondary | ICD-10-CM | POA: Diagnosis not present

## 2017-10-22 DIAGNOSIS — L218 Other seborrheic dermatitis: Secondary | ICD-10-CM | POA: Diagnosis not present

## 2017-10-22 DIAGNOSIS — L239 Allergic contact dermatitis, unspecified cause: Secondary | ICD-10-CM | POA: Diagnosis not present

## 2017-10-22 DIAGNOSIS — B078 Other viral warts: Secondary | ICD-10-CM | POA: Diagnosis not present

## 2017-12-31 ENCOUNTER — Other Ambulatory Visit: Payer: Self-pay | Admitting: Obstetrics & Gynecology

## 2017-12-31 DIAGNOSIS — Z1231 Encounter for screening mammogram for malignant neoplasm of breast: Secondary | ICD-10-CM

## 2018-02-10 ENCOUNTER — Ambulatory Visit
Admission: RE | Admit: 2018-02-10 | Discharge: 2018-02-10 | Disposition: A | Discharge status: Home / Self Care | Payer: Medicare HMO | Source: Ambulatory Visit | Attending: Obstetrics & Gynecology | Admitting: Obstetrics & Gynecology

## 2018-02-10 DIAGNOSIS — Z1231 Encounter for screening mammogram for malignant neoplasm of breast: Secondary | ICD-10-CM | POA: Diagnosis not present

## 2018-03-23 ENCOUNTER — Ambulatory Visit
Admission: RE | Admit: 2018-03-23 | Discharge: 2018-03-23 | Disposition: A | Discharge status: Home / Self Care | Payer: Medicare HMO | Source: Ambulatory Visit | Attending: Internal Medicine | Admitting: Internal Medicine

## 2018-03-23 ENCOUNTER — Other Ambulatory Visit: Payer: Self-pay | Admitting: Internal Medicine

## 2018-03-23 DIAGNOSIS — M79641 Pain in right hand: Secondary | ICD-10-CM

## 2018-03-23 DIAGNOSIS — K219 Gastro-esophageal reflux disease without esophagitis: Secondary | ICD-10-CM | POA: Diagnosis not present

## 2018-03-23 DIAGNOSIS — J4520 Mild intermittent asthma, uncomplicated: Secondary | ICD-10-CM | POA: Diagnosis not present

## 2018-03-23 DIAGNOSIS — G5603 Carpal tunnel syndrome, bilateral upper limbs: Secondary | ICD-10-CM | POA: Diagnosis not present

## 2018-03-23 DIAGNOSIS — Z0000 Encounter for general adult medical examination without abnormal findings: Secondary | ICD-10-CM | POA: Diagnosis not present

## 2018-03-23 DIAGNOSIS — F3341 Major depressive disorder, recurrent, in partial remission: Secondary | ICD-10-CM | POA: Diagnosis not present

## 2018-03-23 DIAGNOSIS — R202 Paresthesia of skin: Secondary | ICD-10-CM | POA: Diagnosis not present

## 2018-03-23 DIAGNOSIS — Z1389 Encounter for screening for other disorder: Secondary | ICD-10-CM | POA: Diagnosis not present

## 2018-04-13 DIAGNOSIS — M7989 Other specified soft tissue disorders: Secondary | ICD-10-CM | POA: Diagnosis not present

## 2018-04-13 DIAGNOSIS — M79644 Pain in right finger(s): Secondary | ICD-10-CM | POA: Diagnosis not present

## 2018-04-13 DIAGNOSIS — S63632A Sprain of interphalangeal joint of right middle finger, initial encounter: Secondary | ICD-10-CM | POA: Diagnosis not present

## 2018-04-14 DIAGNOSIS — M7989 Other specified soft tissue disorders: Secondary | ICD-10-CM | POA: Diagnosis not present

## 2018-05-08 DIAGNOSIS — I499 Cardiac arrhythmia, unspecified: Secondary | ICD-10-CM | POA: Diagnosis not present

## 2018-05-08 DIAGNOSIS — R52 Pain, unspecified: Secondary | ICD-10-CM | POA: Diagnosis not present

## 2018-05-08 DIAGNOSIS — R918 Other nonspecific abnormal finding of lung field: Secondary | ICD-10-CM | POA: Diagnosis not present

## 2018-05-08 DIAGNOSIS — R002 Palpitations: Secondary | ICD-10-CM | POA: Diagnosis not present

## 2018-05-08 DIAGNOSIS — F17210 Nicotine dependence, cigarettes, uncomplicated: Secondary | ICD-10-CM | POA: Diagnosis not present

## 2018-09-10 DIAGNOSIS — S63412A Traumatic rupture of collateral ligament of right middle finger at metacarpophalangeal and interphalangeal joint, initial encounter: Secondary | ICD-10-CM | POA: Diagnosis not present

## 2018-09-14 DIAGNOSIS — S63412A Traumatic rupture of collateral ligament of right middle finger at metacarpophalangeal and interphalangeal joint, initial encounter: Secondary | ICD-10-CM | POA: Diagnosis not present

## 2018-09-14 DIAGNOSIS — G8921 Chronic pain due to trauma: Secondary | ICD-10-CM | POA: Diagnosis not present

## 2018-09-14 DIAGNOSIS — M24541 Contracture, right hand: Secondary | ICD-10-CM | POA: Diagnosis not present

## 2018-09-14 DIAGNOSIS — R202 Paresthesia of skin: Secondary | ICD-10-CM | POA: Diagnosis not present

## 2018-09-14 DIAGNOSIS — M6281 Muscle weakness (generalized): Secondary | ICD-10-CM | POA: Diagnosis not present

## 2018-09-14 DIAGNOSIS — M21241 Flexion deformity, right finger joints: Secondary | ICD-10-CM | POA: Diagnosis not present

## 2018-09-14 DIAGNOSIS — G5600 Carpal tunnel syndrome, unspecified upper limb: Secondary | ICD-10-CM | POA: Diagnosis not present

## 2018-09-14 DIAGNOSIS — M79641 Pain in right hand: Secondary | ICD-10-CM | POA: Diagnosis not present

## 2018-09-22 DIAGNOSIS — R202 Paresthesia of skin: Secondary | ICD-10-CM | POA: Diagnosis not present

## 2018-09-22 DIAGNOSIS — S63412A Traumatic rupture of collateral ligament of right middle finger at metacarpophalangeal and interphalangeal joint, initial encounter: Secondary | ICD-10-CM | POA: Diagnosis not present

## 2018-09-22 DIAGNOSIS — M21241 Flexion deformity, right finger joints: Secondary | ICD-10-CM | POA: Diagnosis not present

## 2018-09-22 DIAGNOSIS — M24541 Contracture, right hand: Secondary | ICD-10-CM | POA: Diagnosis not present

## 2018-09-22 DIAGNOSIS — M79641 Pain in right hand: Secondary | ICD-10-CM | POA: Diagnosis not present

## 2018-09-22 DIAGNOSIS — G8921 Chronic pain due to trauma: Secondary | ICD-10-CM | POA: Diagnosis not present

## 2018-09-22 DIAGNOSIS — G5600 Carpal tunnel syndrome, unspecified upper limb: Secondary | ICD-10-CM | POA: Diagnosis not present

## 2018-09-22 DIAGNOSIS — M6281 Muscle weakness (generalized): Secondary | ICD-10-CM | POA: Diagnosis not present

## 2018-10-01 DIAGNOSIS — G5600 Carpal tunnel syndrome, unspecified upper limb: Secondary | ICD-10-CM | POA: Diagnosis not present

## 2018-10-01 DIAGNOSIS — M21241 Flexion deformity, right finger joints: Secondary | ICD-10-CM | POA: Diagnosis not present

## 2018-10-01 DIAGNOSIS — R202 Paresthesia of skin: Secondary | ICD-10-CM | POA: Diagnosis not present

## 2018-10-01 DIAGNOSIS — S63412A Traumatic rupture of collateral ligament of right middle finger at metacarpophalangeal and interphalangeal joint, initial encounter: Secondary | ICD-10-CM | POA: Diagnosis not present

## 2018-10-01 DIAGNOSIS — M79641 Pain in right hand: Secondary | ICD-10-CM | POA: Diagnosis not present

## 2018-10-01 DIAGNOSIS — G8921 Chronic pain due to trauma: Secondary | ICD-10-CM | POA: Diagnosis not present

## 2018-10-01 DIAGNOSIS — M6281 Muscle weakness (generalized): Secondary | ICD-10-CM | POA: Diagnosis not present

## 2018-10-01 DIAGNOSIS — M24541 Contracture, right hand: Secondary | ICD-10-CM | POA: Diagnosis not present

## 2018-10-09 DIAGNOSIS — G5600 Carpal tunnel syndrome, unspecified upper limb: Secondary | ICD-10-CM | POA: Diagnosis not present

## 2018-10-09 DIAGNOSIS — G8921 Chronic pain due to trauma: Secondary | ICD-10-CM | POA: Diagnosis not present

## 2018-10-09 DIAGNOSIS — M24541 Contracture, right hand: Secondary | ICD-10-CM | POA: Diagnosis not present

## 2018-10-09 DIAGNOSIS — R202 Paresthesia of skin: Secondary | ICD-10-CM | POA: Diagnosis not present

## 2018-10-09 DIAGNOSIS — M79641 Pain in right hand: Secondary | ICD-10-CM | POA: Diagnosis not present

## 2018-10-09 DIAGNOSIS — M6281 Muscle weakness (generalized): Secondary | ICD-10-CM | POA: Diagnosis not present

## 2018-10-09 DIAGNOSIS — M21241 Flexion deformity, right finger joints: Secondary | ICD-10-CM | POA: Diagnosis not present

## 2018-10-09 DIAGNOSIS — S63412A Traumatic rupture of collateral ligament of right middle finger at metacarpophalangeal and interphalangeal joint, initial encounter: Secondary | ICD-10-CM | POA: Diagnosis not present

## 2019-01-28 DIAGNOSIS — H6092 Unspecified otitis externa, left ear: Secondary | ICD-10-CM | POA: Diagnosis not present

## 2019-01-28 DIAGNOSIS — L0292 Furuncle, unspecified: Secondary | ICD-10-CM | POA: Diagnosis not present

## 2019-01-28 DIAGNOSIS — R21 Rash and other nonspecific skin eruption: Secondary | ICD-10-CM | POA: Diagnosis not present

## 2019-02-01 DIAGNOSIS — H6092 Unspecified otitis externa, left ear: Secondary | ICD-10-CM | POA: Diagnosis not present

## 2019-02-01 DIAGNOSIS — Z20828 Contact with and (suspected) exposure to other viral communicable diseases: Secondary | ICD-10-CM | POA: Diagnosis not present

## 2019-02-01 DIAGNOSIS — L0292 Furuncle, unspecified: Secondary | ICD-10-CM | POA: Diagnosis not present

## 2019-12-10 IMAGING — CR DG HAND 2V*R*
2 series · 2 of 2 positions shown · non-contrast
Comparison: None.

CLINICAL DATA: Right hand pain.

EXAM:
RIGHT HAND - 2 VIEW

[x hand pa right]
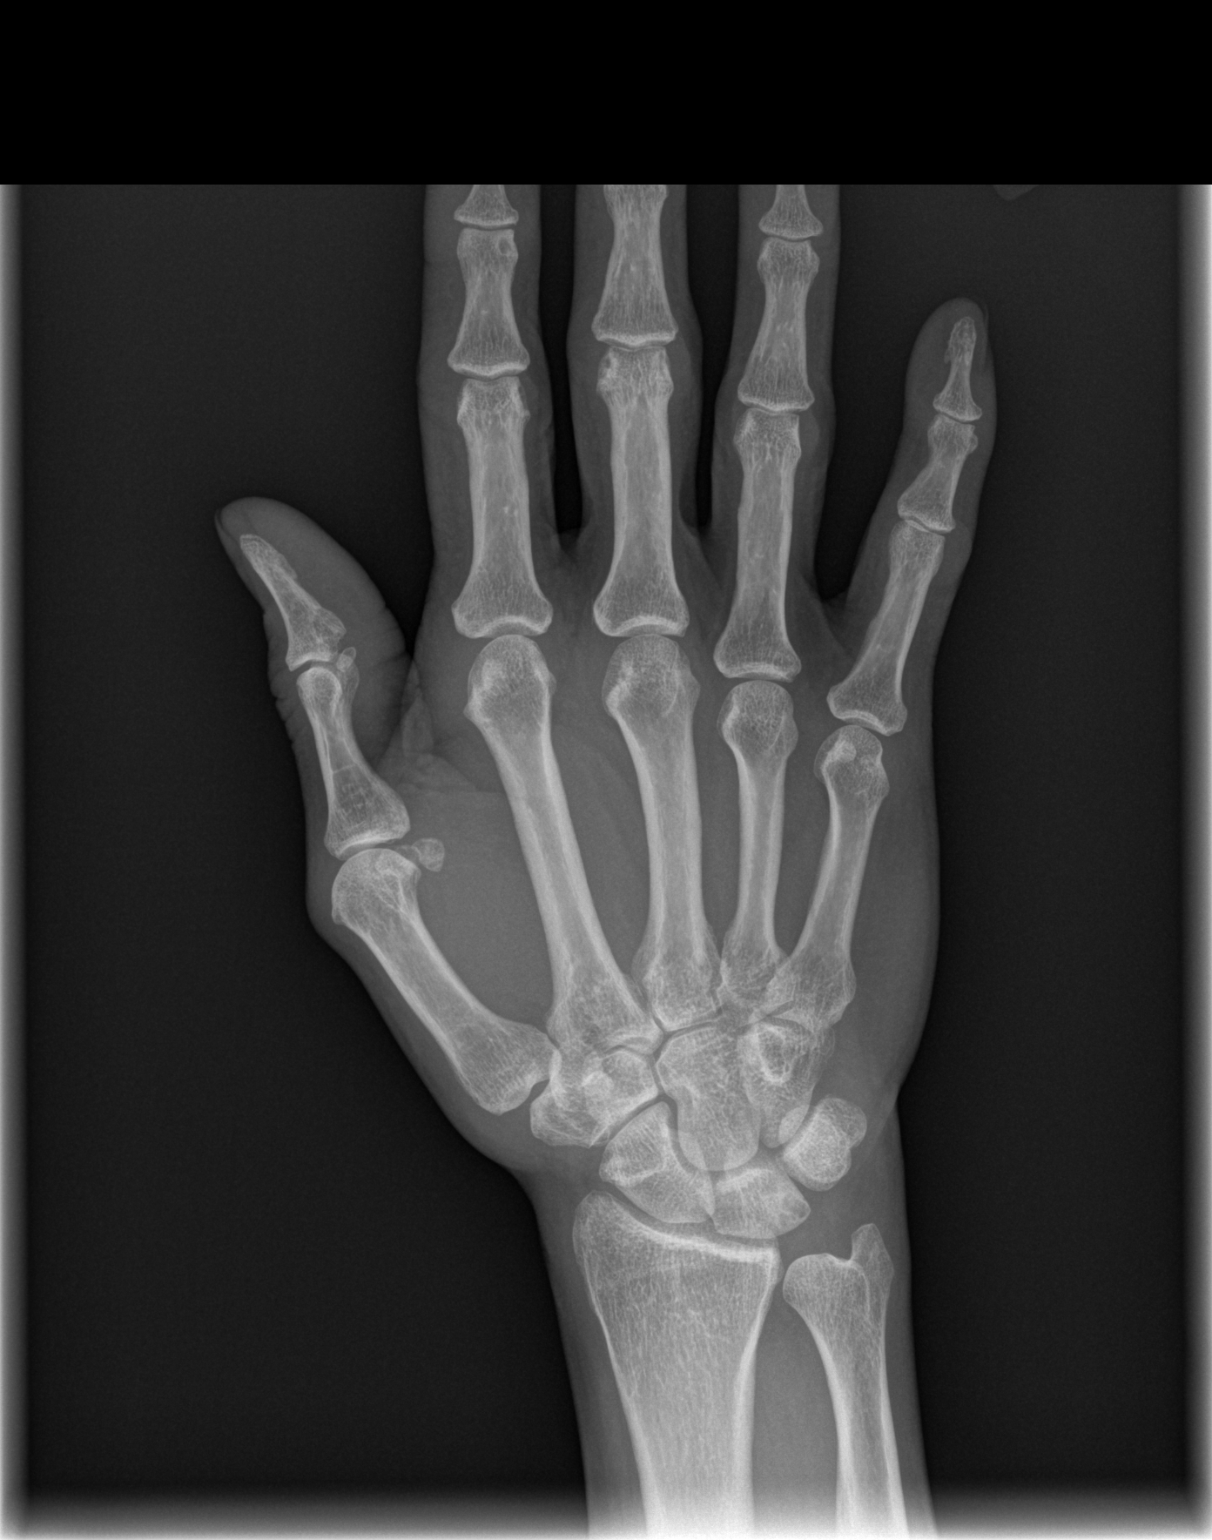

[x hand lat right]
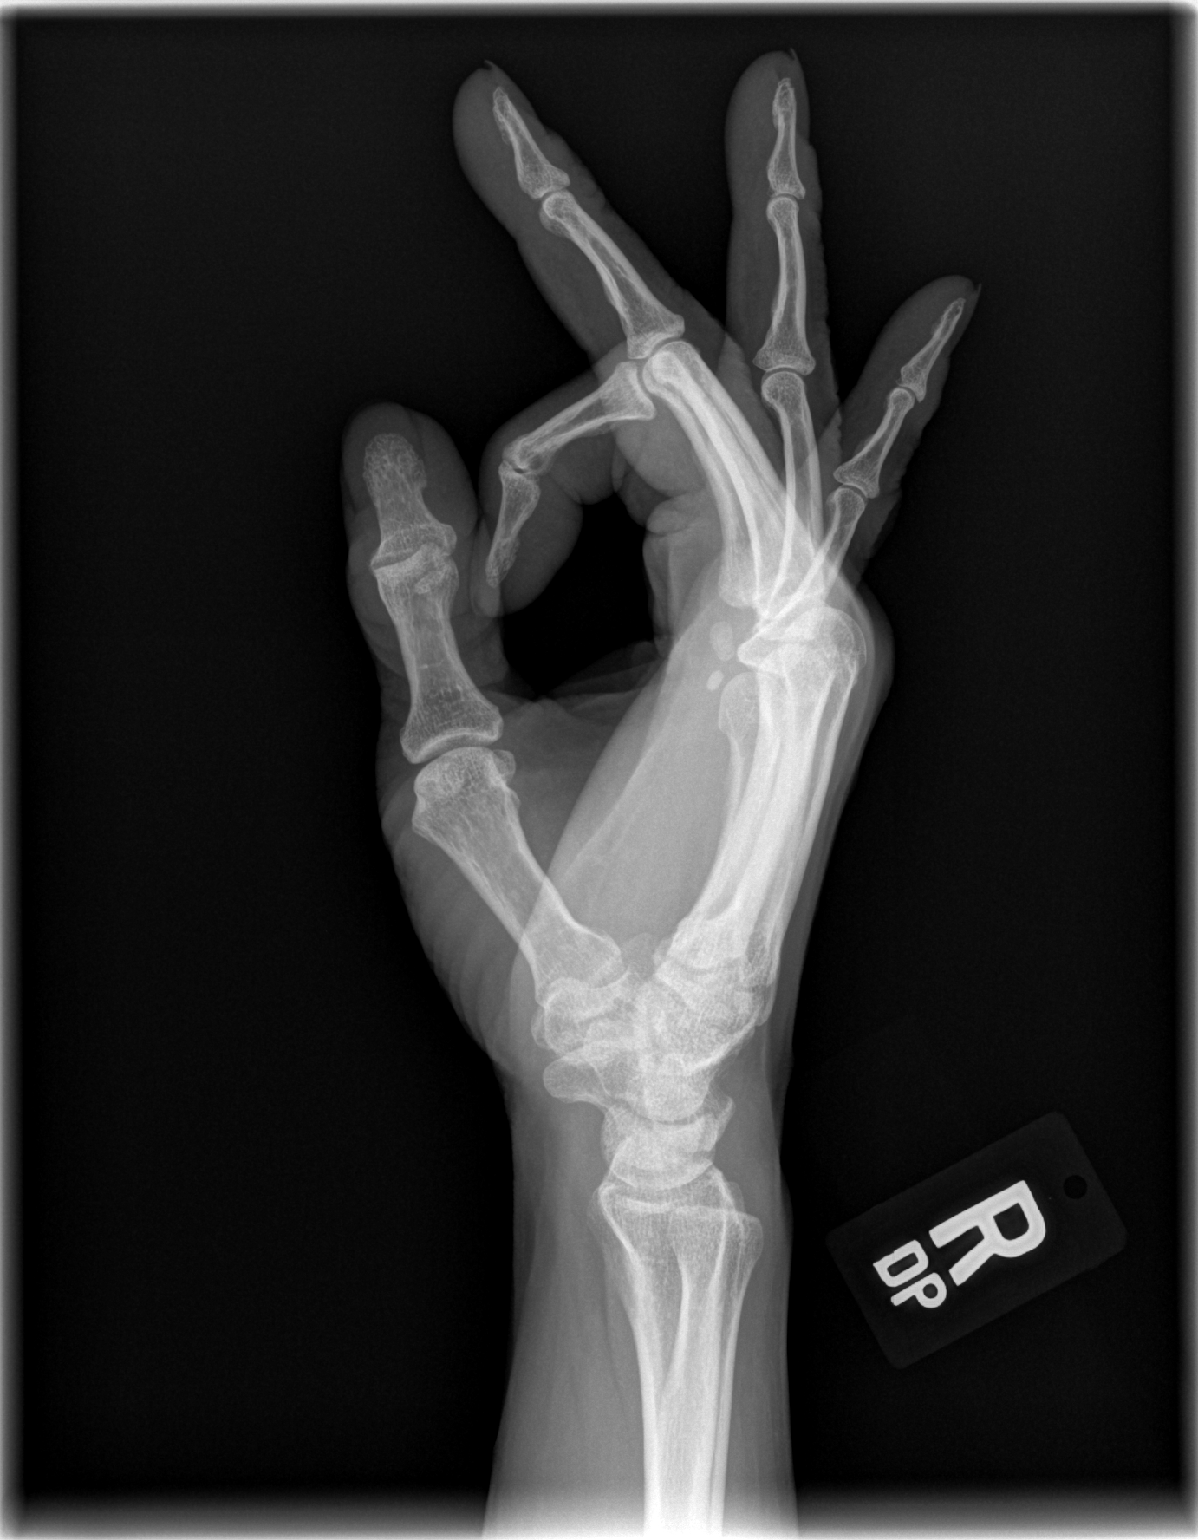

[2 of 2 positions shown; findings below may reference images not displayed]

FINDINGS: No acute fracture or dislocation. No bony erosions or periostitis.
Joint spaces are preserved. Bone mineralization is normal. Soft
tissues are unremarkable.
IMPRESSION: Negative.
